# Patient Record
Sex: Male | Born: 1935 | Race: White | Hispanic: No | Smoking: Never smoker
Health system: Southern US, Community
[De-identification: ages and names within clinical notes are randomized; demographics above are authoritative.]

## PROBLEM LIST (undated history)

## (undated) DIAGNOSIS — I4891 Unspecified atrial fibrillation: Secondary | ICD-10-CM

## (undated) DIAGNOSIS — J310 Chronic rhinitis: Secondary | ICD-10-CM

## (undated) DIAGNOSIS — C801 Malignant (primary) neoplasm, unspecified: Secondary | ICD-10-CM

## (undated) DIAGNOSIS — I5032 Chronic diastolic (congestive) heart failure: Secondary | ICD-10-CM

## (undated) DIAGNOSIS — K219 Gastro-esophageal reflux disease without esophagitis: Secondary | ICD-10-CM

## (undated) DIAGNOSIS — I10 Essential (primary) hypertension: Secondary | ICD-10-CM

## (undated) HISTORY — PX: INNER EAR SURGERY: SHX679

## (undated) HISTORY — PX: EYE SURGERY: SHX253

## (undated) HISTORY — PX: HERNIA REPAIR: SHX51

---

## 2006-10-17 ENCOUNTER — Ambulatory Visit: Payer: Self-pay | Admitting: Internal Medicine

## 2006-10-22 ENCOUNTER — Ambulatory Visit: Payer: Self-pay | Admitting: Internal Medicine

## 2007-02-12 ENCOUNTER — Emergency Department: Payer: Self-pay | Admitting: Emergency Medicine

## 2007-02-12 ENCOUNTER — Other Ambulatory Visit: Payer: Self-pay

## 2007-02-25 ENCOUNTER — Ambulatory Visit: Payer: Self-pay | Admitting: Urology

## 2007-02-26 ENCOUNTER — Ambulatory Visit: Payer: Self-pay | Admitting: Urology

## 2007-05-18 ENCOUNTER — Ambulatory Visit: Payer: Self-pay | Admitting: Urology

## 2007-06-18 ENCOUNTER — Ambulatory Visit: Payer: Self-pay | Admitting: Unknown Physician Specialty

## 2010-11-15 ENCOUNTER — Ambulatory Visit: Payer: Self-pay | Admitting: Internal Medicine

## 2011-02-16 ENCOUNTER — Emergency Department: Payer: Self-pay | Admitting: Emergency Medicine

## 2012-08-06 DIAGNOSIS — H43813 Vitreous degeneration, bilateral: Secondary | ICD-10-CM | POA: Insufficient documentation

## 2012-08-06 DIAGNOSIS — H33059 Total retinal detachment, unspecified eye: Secondary | ICD-10-CM | POA: Insufficient documentation

## 2012-08-06 DIAGNOSIS — H269 Unspecified cataract: Secondary | ICD-10-CM | POA: Insufficient documentation

## 2013-07-07 DIAGNOSIS — J309 Allergic rhinitis, unspecified: Secondary | ICD-10-CM | POA: Insufficient documentation

## 2013-07-07 DIAGNOSIS — I10 Essential (primary) hypertension: Secondary | ICD-10-CM | POA: Insufficient documentation

## 2013-07-07 DIAGNOSIS — N400 Enlarged prostate without lower urinary tract symptoms: Secondary | ICD-10-CM | POA: Insufficient documentation

## 2013-07-07 DIAGNOSIS — K219 Gastro-esophageal reflux disease without esophagitis: Secondary | ICD-10-CM | POA: Insufficient documentation

## 2013-11-20 ENCOUNTER — Emergency Department: Payer: Self-pay | Admitting: Emergency Medicine

## 2013-11-20 LAB — TROPONIN I: Troponin-I: <0.02 ng/mL

## 2013-11-20 LAB — CBC
HCT: 44.2 % (ref 40.0–52.0)
HGB: 14.5 g/dL (ref 13.0–18.0)
MCH: 30 pg (ref 26.0–34.0)
MCHC: 32.7 g/dL (ref 32.0–36.0)
MCV: 92 fL (ref 80–100)
Platelet: 115 10*3/uL — ABNORMAL LOW (ref 150–440)
RBC: 4.82 10*6/uL (ref 4.40–5.90)
RDW: 13.4 % (ref 11.5–14.5)
WBC: 6.5 10*3/uL (ref 3.8–10.6)

## 2013-11-20 LAB — COMPREHENSIVE METABOLIC PANEL
ALBUMIN: 3.6 g/dL (ref 3.4–5.0)
ALT: 19 U/L
ANION GAP: 9 (ref 7–16)
Alkaline Phosphatase: 51 U/L
BILIRUBIN TOTAL: 0.8 mg/dL (ref 0.2–1.0)
BUN: 13 mg/dL (ref 7–18)
CALCIUM: 8.1 mg/dL — AB (ref 8.5–10.1)
CHLORIDE: 100 mmol/L (ref 98–107)
CREATININE: 0.84 mg/dL (ref 0.60–1.30)
Co2: 24 mmol/L (ref 21–32)
EGFR (African American): >60
Glucose: 93 mg/dL (ref 65–99)
Osmolality: 266 (ref 275–301)
Potassium: 3.5 mmol/L (ref 3.5–5.1)
SGOT(AST): 18 U/L (ref 15–37)
SODIUM: 133 mmol/L — AB (ref 136–145)
Total Protein: 6.5 g/dL (ref 6.4–8.2)

## 2014-09-02 ENCOUNTER — Encounter: Payer: Self-pay | Admitting: *Deleted

## 2014-09-05 ENCOUNTER — Encounter: Payer: Self-pay | Admitting: Anesthesiology

## 2014-09-05 ENCOUNTER — Ambulatory Visit: Payer: Medicare Other | Admitting: Anesthesiology

## 2014-09-05 ENCOUNTER — Encounter
Admission: RE | Disposition: A | Discharge status: Home / Self Care | Payer: Self-pay | Source: Ambulatory Visit | Attending: Unknown Physician Specialty

## 2014-09-05 ENCOUNTER — Ambulatory Visit
Admission: RE | Admit: 2014-09-05 | Discharge: 2014-09-05 | Disposition: A | Discharge status: Home / Self Care | Payer: Medicare Other | Source: Ambulatory Visit | Attending: Unknown Physician Specialty | Admitting: Unknown Physician Specialty

## 2014-09-05 DIAGNOSIS — K5730 Diverticulosis of large intestine without perforation or abscess without bleeding: Secondary | ICD-10-CM | POA: Insufficient documentation

## 2014-09-05 DIAGNOSIS — Z86010 Personal history of colonic polyps: Secondary | ICD-10-CM | POA: Diagnosis not present

## 2014-09-05 DIAGNOSIS — I10 Essential (primary) hypertension: Secondary | ICD-10-CM | POA: Diagnosis not present

## 2014-09-05 DIAGNOSIS — Z7982 Long term (current) use of aspirin: Secondary | ICD-10-CM | POA: Insufficient documentation

## 2014-09-05 DIAGNOSIS — Z1211 Encounter for screening for malignant neoplasm of colon: Secondary | ICD-10-CM | POA: Diagnosis present

## 2014-09-05 DIAGNOSIS — Z79899 Other long term (current) drug therapy: Secondary | ICD-10-CM | POA: Insufficient documentation

## 2014-09-05 DIAGNOSIS — K648 Other hemorrhoids: Secondary | ICD-10-CM | POA: Insufficient documentation

## 2014-09-05 DIAGNOSIS — K219 Gastro-esophageal reflux disease without esophagitis: Secondary | ICD-10-CM | POA: Insufficient documentation

## 2014-09-05 HISTORY — DX: Essential (primary) hypertension: I10

## 2014-09-05 HISTORY — DX: Gastro-esophageal reflux disease without esophagitis: K21.9

## 2014-09-05 HISTORY — DX: Chronic rhinitis: J31.0

## 2014-09-05 HISTORY — PX: COLONOSCOPY WITH PROPOFOL: SHX5780

## 2014-09-05 SURGERY — COLONOSCOPY WITH PROPOFOL
Anesthesia: General

## 2014-09-05 MED ORDER — SODIUM CHLORIDE 0.9 % IV SOLN
INTRAVENOUS | Status: DC
  Administered 2014-09-05: 1000 mL via INTRAVENOUS

## 2014-09-05 MED ORDER — PROPOFOL INFUSION 10 MG/ML OPTIME
INTRAVENOUS | Status: DC | PRN
  Administered 2014-09-05: 75 ug/kg/min via INTRAVENOUS

## 2014-09-05 MED ORDER — SODIUM CHLORIDE 0.9 % IV SOLN: INTRAVENOUS | Status: DC

## 2014-09-05 MED ORDER — FENTANYL CITRATE (PF) 100 MCG/2ML IJ SOLN
INTRAMUSCULAR | Status: DC | PRN
  Administered 2014-09-05: 50 ug via INTRAVENOUS

## 2014-09-05 MED ORDER — MIDAZOLAM HCL 2 MG/2ML IJ SOLN
INTRAMUSCULAR | Status: DC | PRN
  Administered 2014-09-05: 1 mg via INTRAVENOUS

## 2014-09-05 NOTE — Anesthesia Postprocedure Evaluation (Signed)
  Anesthesia Post-op Note  Patient: Derek James  Procedure(s) Performed: Procedure(s): COLONOSCOPY WITH PROPOFOL (N/A)  Anesthesia type:General  Patient location: PACU  Post pain: Pain level controlled  Post assessment: Post-op Vital signs reviewed, Patient's Cardiovascular Status Stable, Respiratory Function Stable, Patent Airway and No signs of Nausea or vomiting  Post vital signs: Reviewed and stable  Last Vitals:  Filed Vitals:   09/05/14 0900  BP: 126/66  Pulse: 69  Temp:   Resp: 20    Level of consciousness: awake, alert  and patient cooperative  Complications: No apparent anesthesia complications

## 2014-09-05 NOTE — Anesthesia Procedure Notes (Signed)
Performed by: COOK-MARTIN, Barbara Keng Pre-anesthesia Checklist: Patient identified, Emergency Drugs available, Suction available, Patient being monitored and Timeout performed Patient Re-evaluated:Patient Re-evaluated prior to inductionOxygen Delivery Method: Nasal cannula Preoxygenation: Pre-oxygenation with 100% oxygen Intubation Type: IV induction Placement Confirmation: positive ETCO2 and CO2 detector       

## 2014-09-05 NOTE — Op Note (Signed)
[facility] Gastroenterology Patient Name: Derek James Procedure Date: 09/05/2014 7:44 AM MRN: [id number] Account #: [id number] Date of Birth: 11/16/1930 Admit Type: Outpatient Age: 80 Room: [facility] ENDO ROOM 1 Gender: Male Note Status: Finalized Procedure:         Colonoscopy Indications:       Personal history of colonic polyps Providers:         Manya Silvas, MD Referring MD:      Caprice Renshaw (Referring MD) Medicines:         Propofol per Anesthesia Complications:     No immediate complications. Procedure:         Pre-Anesthesia Assessment:                    - After reviewing the risks and benefits, the patient was                     deemed in satisfactory condition to undergo the procedure.                    After obtaining informed consent, the colonoscope was                     passed under direct vision. Throughout the procedure, the                     patient's blood pressure, pulse, and oxygen saturations                     were monitored continuously. The Colonoscope was                     introduced through the anus and advanced to the the cecum,                     identified by appendiceal orifice and ileocecal valve. The                     colonoscopy was somewhat difficult due to restricted                     mobility of the colon and a tortuous colon. The patient                     tolerated the procedure well. The quality of the bowel                     preparation was adequate to identify polyps. Findings:      Multiple medium-mouthed diverticula were found in the sigmoid colon and       in the descending colon.      Internal hemorrhoids were found during endoscopy. The hemorrhoids were       small and Grade I (internal hemorrhoids that do not prolapse).      The exam was otherwise without abnormality. Impression:        - Diverticulosis in the sigmoid colon and in the                     descending colon.        - Internal hemorrhoids.                    - The examination was otherwise normal.                    -  No specimens collected. Recommendation:    - The findings and recommendations were discussed with the                     patient's family. Manya Silvas, MD 09/05/2014 8:29:24 AM This report has been signed electronically. Number of Addenda: 0 Note Initiated On: 09/05/2014 7:44 AM Scope Withdrawal Time: 0 hours 8 minutes 19 seconds  Total Procedure Duration: 0 hours 17 minutes 52 seconds       [facility]

## 2014-09-05 NOTE — H&P (Signed)
Primary Care Physician:  Rocco Serene, MD Primary Gastroenterologist:  Dr. Vira Agar  Pre-Procedure History & Physical: HPI:  Derek James is a 81 y.o. male is here for an colonoscopy.   Past Medical History  Diagnosis Date  . Hypertension   . Rhinitis   . GERD (gastroesophageal reflux disease)     Past Surgical History  Procedure Laterality Date  . Inner ear surgery    . Hernia repair    . Eye surgery      retinal detatchment    Prior to Admission medications   Medication Sig Start Date End Date Taking? Authorizing Provider  aspirin EC 81 MG tablet Take 81 mg by mouth daily.   Yes Historical Provider, MD  esomeprazole (NEXIUM) 20 MG capsule Take 20 mg by mouth daily at 12 noon.   Yes Historical Provider, MD  hydrochlorothiazide (HYDRODIURIL) 25 MG tablet Take 25 mg by mouth daily.   Yes Historical Provider, MD  loratadine (CLARITIN) 10 MG tablet Take 10 mg by mouth daily.   Yes Historical Provider, MD  tamsulosin (FLOMAX) 0.4 MG CAPS capsule Take 0.4 mg by mouth.   Yes Historical Provider, MD    Allergies as of 08/03/2014  . (Not on File)    History reviewed. No pertinent family history.  History   Social History  . Marital Status: Widowed    Spouse Name: N/A  . Number of Children: N/A  . Years of Education: N/A   Occupational History  . Not on file.   Social History Main Topics  . Smoking status: Never Smoker   . Smokeless tobacco: Not on file  . Alcohol Use: Not on file  . Drug Use: Not on file  . Sexual Activity: Not on file   Other Topics Concern  . Not on file   Social History Narrative    Review of Systems: See HPI, otherwise negative ROS  Physical Exam: BP 93/56 mmHg  Pulse 68  Temp(Src) 96.2 F (35.7 C) (Tympanic)  Resp 17  Ht 5\' 5"  (1.651 m)  Wt 68.04 kg (150 lb)  BMI 24.96 kg/m2  SpO2 98% General:   Alert,  pleasant and cooperative in NAD Head:  Normocephalic and atraumatic. Neck:  Supple; no masses or  thyromegaly. Lungs:  Clear throughout to auscultation.    Heart:  Regular rate and rhythm. Abdomen:  Soft, nontender and nondistended. Normal bowel sounds, without guarding, and without rebound.   Neurologic:  Alert and  oriented x4;  grossly normal neurologically.  Impression/Plan: Derek James is here for an colonoscopy to be performed for personal history of colon polyps  Risks, benefits, limitations, and alternatives regarding  colonoscopy have been reviewed with the patient.  Questions have been answered.  All parties agreeable.   Gaylyn Cheers, MD  09/05/2014, 8:32 AM   Primary Care Physician:  Rocco Serene, MD Primary Gastroenterologist:  Dr. Vira Agar  Pre-Procedure History & Physical: HPI:  Derek James is a 81 y.o. male is here for an colonoscopy.   Past Medical History  Diagnosis Date  . Hypertension   . Rhinitis   . GERD (gastroesophageal reflux disease)     Past Surgical History  Procedure Laterality Date  . Inner ear surgery    . Hernia repair    . Eye surgery      retinal detatchment    Prior to Admission medications   Medication Sig Start Date End Date Taking? Authorizing Provider  aspirin EC 81 MG tablet Take  81 mg by mouth daily.   Yes Historical Provider, MD  esomeprazole (NEXIUM) 20 MG capsule Take 20 mg by mouth daily at 12 noon.   Yes Historical Provider, MD  hydrochlorothiazide (HYDRODIURIL) 25 MG tablet Take 25 mg by mouth daily.   Yes Historical Provider, MD  loratadine (CLARITIN) 10 MG tablet Take 10 mg by mouth daily.   Yes Historical Provider, MD  tamsulosin (FLOMAX) 0.4 MG CAPS capsule Take 0.4 mg by mouth.   Yes Historical Provider, MD    Allergies as of 08/03/2014  . (Not on File)    History reviewed. No pertinent family history.  History   Social History  . Marital Status: Widowed    Spouse Name: N/A  . Number of Children: N/A  . Years of Education: N/A   Occupational History  . Not on file.   Social History  Main Topics  . Smoking status: Never Smoker   . Smokeless tobacco: Not on file  . Alcohol Use: Not on file  . Drug Use: Not on file  . Sexual Activity: Not on file   Other Topics Concern  . Not on file   Social History Narrative    Review of Systems: See HPI, otherwise negative ROS  Physical Exam: BP 93/56 mmHg  Pulse 68  Temp(Src) 96.2 F (35.7 C) (Tympanic)  Resp 17  Ht 5\' 5"  (1.651 m)  Wt 68.04 kg (150 lb)  BMI 24.96 kg/m2  SpO2 98% General:   Alert,  pleasant and cooperative in NAD Head:  Normocephalic and atraumatic. Neck:  Supple; no masses or thyromegaly. Lungs:  Clear throughout to auscultation.    Heart:  Regular rate and rhythm. Abdomen:  Soft, nontender and nondistended. Normal bowel sounds, without guarding, and without rebound.   Neurologic:  Alert and  oriented x4;  grossly normal neurologically.  Impression/Plan: Derek James is here for an colonoscopy to be performed for personal history of colon polyps  Risks, benefits, limitations, and alternatives regarding  colonoscopy have been reviewed with the patient.  Questions have been answered.  All parties agreeable.   Gaylyn Cheers, MD  09/05/2014, 8:32 AM

## 2014-09-05 NOTE — Transfer of Care (Signed)
Immediate Anesthesia Transfer of Care Note  Patient: Derek James  Procedure(s) Performed: Procedure(s): COLONOSCOPY WITH PROPOFOL (N/A)  Patient Location: PACU  Anesthesia Type:General  Level of Consciousness: awake and sedated  Airway & Oxygen Therapy: Patient Spontanous Breathing and Patient connected to nasal cannula oxygen  Post-op Assessment: Report given to RN and Post -op Vital signs reviewed and stable  Post vital signs: Reviewed and stable  Last Vitals:  Filed Vitals:   09/05/14 0830  BP: 93/56  Pulse: 68  Temp: 35.7 C  Resp: 17    Complications: No apparent anesthesia complications

## 2014-09-05 NOTE — Anesthesia Preprocedure Evaluation (Signed)
Anesthesia Evaluation  Patient identified by MRN, date of birth, ID band Patient confused    Reviewed: Allergy & Precautions, NPO status , Patient's Chart, lab work & pertinent test results  Airway Mallampati: II       Dental no notable dental hx.    Pulmonary neg pulmonary ROS,    Pulmonary exam normal       Cardiovascular hypertension, Pt. on medications Normal cardiovascular exam    Neuro/Psych negative neurological ROS  negative psych ROS   GI/Hepatic Neg liver ROS, GERD-  ,  Endo/Other  negative endocrine ROS  Renal/GU negative Renal ROS     Musculoskeletal negative musculoskeletal ROS (+)   Abdominal Normal abdominal exam  (+)   Peds negative pediatric ROS (+)  Hematology negative hematology ROS (+)   Anesthesia Other Findings   Reproductive/Obstetrics negative OB ROS                             Anesthesia Physical Anesthesia Plan  ASA: III  Anesthesia Plan: General   Post-op Pain Management:    Induction: Intravenous  Airway Management Planned: Nasal Cannula  Additional Equipment:   Intra-op Plan:   Post-operative Plan:   Informed Consent: I have reviewed the patients History and Physical, chart, labs and discussed the procedure including the risks, benefits and alternatives for the proposed anesthesia with the patient or authorized representative who has indicated his/her understanding and acceptance.     Plan Discussed with: CRNA  Anesthesia Plan Comments:         Anesthesia Quick Evaluation

## 2014-09-06 ENCOUNTER — Encounter: Payer: Self-pay | Admitting: Unknown Physician Specialty

## 2014-09-21 DIAGNOSIS — H9072 Mixed conductive and sensorineural hearing loss, unilateral, left ear, with unrestricted hearing on the contralateral side: Secondary | ICD-10-CM | POA: Insufficient documentation

## 2014-09-21 DIAGNOSIS — H722X1 Other marginal perforations of tympanic membrane, right ear: Secondary | ICD-10-CM | POA: Insufficient documentation

## 2014-09-21 DIAGNOSIS — Z9089 Acquired absence of other organs: Secondary | ICD-10-CM | POA: Insufficient documentation

## 2014-09-21 DIAGNOSIS — H9191 Unspecified hearing loss, right ear: Secondary | ICD-10-CM | POA: Insufficient documentation

## 2014-09-21 DIAGNOSIS — H7121 Cholesteatoma of mastoid, right ear: Secondary | ICD-10-CM | POA: Insufficient documentation

## 2015-05-27 ENCOUNTER — Emergency Department: Payer: Medicare Other

## 2015-05-27 ENCOUNTER — Inpatient Hospital Stay
Admission: EM | Admit: 2015-05-27 | Discharge: 2015-06-02 | DRG: 640 | Disposition: A | Discharge status: Home / Self Care | Payer: Medicare Other | Attending: Internal Medicine | Admitting: Internal Medicine

## 2015-05-27 ENCOUNTER — Encounter: Payer: Self-pay | Admitting: Emergency Medicine

## 2015-05-27 DIAGNOSIS — E871 Hypo-osmolality and hyponatremia: Secondary | ICD-10-CM | POA: Diagnosis not present

## 2015-05-27 DIAGNOSIS — Z888 Allergy status to other drugs, medicaments and biological substances status: Secondary | ICD-10-CM | POA: Diagnosis not present

## 2015-05-27 DIAGNOSIS — Z8249 Family history of ischemic heart disease and other diseases of the circulatory system: Secondary | ICD-10-CM

## 2015-05-27 DIAGNOSIS — R0600 Dyspnea, unspecified: Secondary | ICD-10-CM | POA: Diagnosis not present

## 2015-05-27 DIAGNOSIS — I5021 Acute systolic (congestive) heart failure: Secondary | ICD-10-CM | POA: Diagnosis not present

## 2015-05-27 DIAGNOSIS — E46 Unspecified protein-calorie malnutrition: Secondary | ICD-10-CM | POA: Diagnosis present

## 2015-05-27 DIAGNOSIS — J9601 Acute respiratory failure with hypoxia: Secondary | ICD-10-CM | POA: Diagnosis present

## 2015-05-27 DIAGNOSIS — J168 Pneumonia due to other specified infectious organisms: Secondary | ICD-10-CM | POA: Diagnosis present

## 2015-05-27 DIAGNOSIS — E860 Dehydration: Secondary | ICD-10-CM | POA: Diagnosis present

## 2015-05-27 DIAGNOSIS — T502X5A Adverse effect of carbonic-anhydrase inhibitors, benzothiadiazides and other diuretics, initial encounter: Secondary | ICD-10-CM | POA: Diagnosis present

## 2015-05-27 DIAGNOSIS — J309 Allergic rhinitis, unspecified: Secondary | ICD-10-CM | POA: Diagnosis present

## 2015-05-27 DIAGNOSIS — Z7982 Long term (current) use of aspirin: Secondary | ICD-10-CM | POA: Diagnosis not present

## 2015-05-27 DIAGNOSIS — R0989 Other specified symptoms and signs involving the circulatory and respiratory systems: Secondary | ICD-10-CM | POA: Diagnosis present

## 2015-05-27 DIAGNOSIS — I110 Hypertensive heart disease with heart failure: Secondary | ICD-10-CM | POA: Diagnosis present

## 2015-05-27 DIAGNOSIS — Z79899 Other long term (current) drug therapy: Secondary | ICD-10-CM

## 2015-05-27 DIAGNOSIS — R251 Tremor, unspecified: Secondary | ICD-10-CM | POA: Diagnosis present

## 2015-05-27 DIAGNOSIS — I4891 Unspecified atrial fibrillation: Secondary | ICD-10-CM | POA: Diagnosis not present

## 2015-05-27 DIAGNOSIS — K219 Gastro-esophageal reflux disease without esophagitis: Secondary | ICD-10-CM | POA: Diagnosis present

## 2015-05-27 DIAGNOSIS — D6959 Other secondary thrombocytopenia: Secondary | ICD-10-CM | POA: Diagnosis present

## 2015-05-27 DIAGNOSIS — I340 Nonrheumatic mitral (valve) insufficiency: Secondary | ICD-10-CM | POA: Diagnosis present

## 2015-05-27 DIAGNOSIS — R0902 Hypoxemia: Secondary | ICD-10-CM

## 2015-05-27 DIAGNOSIS — I10 Essential (primary) hypertension: Secondary | ICD-10-CM | POA: Diagnosis present

## 2015-05-27 DIAGNOSIS — Z7901 Long term (current) use of anticoagulants: Secondary | ICD-10-CM | POA: Diagnosis not present

## 2015-05-27 DIAGNOSIS — A419 Sepsis, unspecified organism: Secondary | ICD-10-CM | POA: Diagnosis present

## 2015-05-27 DIAGNOSIS — N400 Enlarged prostate without lower urinary tract symptoms: Secondary | ICD-10-CM | POA: Diagnosis present

## 2015-05-27 DIAGNOSIS — R0603 Acute respiratory distress: Secondary | ICD-10-CM | POA: Diagnosis not present

## 2015-05-27 DIAGNOSIS — R05 Cough: Secondary | ICD-10-CM

## 2015-05-27 DIAGNOSIS — I509 Heart failure, unspecified: Secondary | ICD-10-CM

## 2015-05-27 DIAGNOSIS — I5033 Acute on chronic diastolic (congestive) heart failure: Secondary | ICD-10-CM | POA: Diagnosis not present

## 2015-05-27 DIAGNOSIS — R059 Cough, unspecified: Secondary | ICD-10-CM

## 2015-05-27 DIAGNOSIS — R531 Weakness: Secondary | ICD-10-CM

## 2015-05-27 HISTORY — DX: Chronic diastolic (congestive) heart failure: I50.32

## 2015-05-27 HISTORY — DX: Unspecified atrial fibrillation: I48.91

## 2015-05-27 LAB — BASIC METABOLIC PANEL
Anion gap: 7 (ref 5–15)
BUN: 16 mg/dL (ref 6–20)
CHLORIDE: 93 mmol/L — AB (ref 101–111)
CO2: 21 mmol/L — ABNORMAL LOW (ref 22–32)
CREATININE: 0.81 mg/dL (ref 0.61–1.24)
Calcium: 7.4 mg/dL — ABNORMAL LOW (ref 8.9–10.3)
Glucose, Bld: 107 mg/dL — ABNORMAL HIGH (ref 65–99)
POTASSIUM: 3.8 mmol/L (ref 3.5–5.1)
SODIUM: 121 mmol/L — AB (ref 135–145)

## 2015-05-27 LAB — COMPREHENSIVE METABOLIC PANEL
ALBUMIN: 3.7 g/dL (ref 3.5–5.0)
ALK PHOS: 52 U/L (ref 38–126)
ALT: 24 U/L (ref 17–63)
AST: 32 U/L (ref 15–41)
Anion gap: 10 (ref 5–15)
BUN: 13 mg/dL (ref 6–20)
CALCIUM: 8.1 mg/dL — AB (ref 8.9–10.3)
CO2: 22 mmol/L (ref 22–32)
Chloride: 89 mmol/L — ABNORMAL LOW (ref 101–111)
Creatinine, Ser: 0.87 mg/dL (ref 0.61–1.24)
GFR calc non Af Amer: >60 mL/min (ref >60)
GLUCOSE: 94 mg/dL (ref 65–99)
POTASSIUM: 3.8 mmol/L (ref 3.5–5.1)
SODIUM: 121 mmol/L — AB (ref 135–145)
Total Bilirubin: 1.1 mg/dL (ref 0.3–1.2)
Total Protein: 6.6 g/dL (ref 6.5–8.1)

## 2015-05-27 LAB — URINALYSIS COMPLETE WITH MICROSCOPIC (ARMC ONLY)
Bilirubin Urine: NEGATIVE
GLUCOSE, UA: NEGATIVE mg/dL
Leukocytes, UA: NEGATIVE
Nitrite: NEGATIVE
PROTEIN: NEGATIVE mg/dL
Specific Gravity, Urine: 1.017 (ref 1.005–1.030)
Squamous Epithelial / LPF: NONE SEEN
pH: 6 (ref 5.0–8.0)

## 2015-05-27 LAB — CBC WITH DIFFERENTIAL/PLATELET
Basophils Absolute: 0 10*3/uL (ref 0–0.1)
Basophils Relative: 0 %
EOS ABS: 0 10*3/uL (ref 0–0.7)
Eosinophils Relative: 0 %
HCT: 39.8 % — ABNORMAL LOW (ref 40.0–52.0)
HEMOGLOBIN: 13.9 g/dL (ref 13.0–18.0)
LYMPHS ABS: 0.5 10*3/uL — AB (ref 1.0–3.6)
Lymphocytes Relative: 11 %
MCH: 29.9 pg (ref 26.0–34.0)
MCHC: 35 g/dL (ref 32.0–36.0)
MCV: 85.5 fL (ref 80.0–100.0)
MONO ABS: 0.5 10*3/uL (ref 0.2–1.0)
Monocytes Relative: 12 %
Neutro Abs: 3.4 10*3/uL (ref 1.4–6.5)
Platelets: 69 10*3/uL — ABNORMAL LOW (ref 150–440)
RBC: 4.65 MIL/uL (ref 4.40–5.90)
RDW: 13.5 % (ref 11.5–14.5)
WBC: 4.4 10*3/uL (ref 3.8–10.6)

## 2015-05-27 LAB — TROPONIN I: Troponin I: <0.03 ng/mL (ref <0.031)

## 2015-05-27 MED ORDER — ONDANSETRON HCL 4 MG/2ML IJ SOLN: 4.0000 mg | Freq: Four times a day (QID) | INTRAMUSCULAR | Status: DC | PRN

## 2015-05-27 MED ORDER — METOPROLOL TARTRATE 25 MG PO TABS
25.0000 mg | ORAL_TABLET | Freq: Two times a day (BID) | ORAL | Status: DC
  Administered 2015-05-28 – 2015-06-02 (×12): 25 mg via ORAL
  Filled 2015-05-27 (×12): qty 1

## 2015-05-27 MED ORDER — LORATADINE 10 MG PO TABS
10.0000 mg | ORAL_TABLET | Freq: Every day | ORAL | Status: DC
  Administered 2015-05-27 – 2015-06-02 (×7): 10 mg via ORAL
  Filled 2015-05-27 (×8): qty 1

## 2015-05-27 MED ORDER — ACETAMINOPHEN 325 MG PO TABS
650.0000 mg | ORAL_TABLET | Freq: Four times a day (QID) | ORAL | Status: DC | PRN
  Administered 2015-05-27 – 2015-05-28 (×2): 650 mg via ORAL
  Filled 2015-05-27 (×2): qty 2

## 2015-05-27 MED ORDER — DOCUSATE SODIUM 100 MG PO CAPS
100.0000 mg | ORAL_CAPSULE | Freq: Two times a day (BID) | ORAL | Status: DC
  Administered 2015-05-28 – 2015-06-02 (×11): 100 mg via ORAL
  Filled 2015-05-27 (×12): qty 1

## 2015-05-27 MED ORDER — SODIUM CHLORIDE 0.9 % IV SOLN
Freq: Once | INTRAVENOUS | Status: AC
  Administered 2015-05-27: 13:00:00 via INTRAVENOUS

## 2015-05-27 MED ORDER — TAMSULOSIN HCL 0.4 MG PO CAPS
0.4000 mg | ORAL_CAPSULE | Freq: Every day | ORAL | Status: DC
  Administered 2015-05-28 – 2015-06-02 (×6): 0.4 mg via ORAL
  Filled 2015-05-27 (×6): qty 1

## 2015-05-27 MED ORDER — BISACODYL 10 MG RE SUPP: 10.0000 mg | Freq: Every day | RECTAL | Status: DC | PRN

## 2015-05-27 MED ORDER — PANTOPRAZOLE SODIUM 40 MG PO TBEC
40.0000 mg | DELAYED_RELEASE_TABLET | Freq: Every day | ORAL | Status: DC
  Administered 2015-05-28: 40 mg via ORAL
  Filled 2015-05-27 (×2): qty 1

## 2015-05-27 MED ORDER — ASPIRIN EC 81 MG PO TBEC
81.0000 mg | DELAYED_RELEASE_TABLET | Freq: Every day | ORAL | Status: DC
  Administered 2015-05-27 – 2015-05-29 (×3): 81 mg via ORAL
  Filled 2015-05-27 (×3): qty 1

## 2015-05-27 MED ORDER — FLUTICASONE PROPIONATE 50 MCG/ACT NA SUSP
2.0000 | Freq: Every day | NASAL | Status: DC
  Administered 2015-05-27 – 2015-06-01 (×6): 2 via NASAL
  Filled 2015-05-27: qty 16

## 2015-05-27 MED ORDER — HEPARIN SODIUM (PORCINE) 5000 UNIT/ML IJ SOLN
5000.0000 [IU] | Freq: Three times a day (TID) | INTRAMUSCULAR | Status: DC
  Administered 2015-05-28: 5000 [IU] via SUBCUTANEOUS
  Filled 2015-05-27 (×2): qty 1

## 2015-05-27 MED ORDER — ONDANSETRON HCL 4 MG PO TABS
4.0000 mg | ORAL_TABLET | Freq: Four times a day (QID) | ORAL | Status: DC | PRN
Start: 2015-05-27 — End: 2015-06-02

## 2015-05-27 MED ORDER — ACETAMINOPHEN 650 MG RE SUPP: 650.0000 mg | Freq: Four times a day (QID) | RECTAL | Status: DC | PRN

## 2015-05-27 MED ORDER — AMLODIPINE BESYLATE 5 MG PO TABS
5.0000 mg | ORAL_TABLET | Freq: Every day | ORAL | Status: DC
  Administered 2015-05-28 – 2015-05-30 (×3): 5 mg via ORAL
  Filled 2015-05-27 (×3): qty 1

## 2015-05-27 MED ORDER — SODIUM CHLORIDE 0.9 % IV SOLN
INTRAVENOUS | Status: DC
  Administered 2015-05-27: 19:00:00 via INTRAVENOUS

## 2015-05-27 MED ORDER — ZOLPIDEM TARTRATE 5 MG PO TABS
5.0000 mg | ORAL_TABLET | Freq: Every evening | ORAL | Status: DC | PRN
  Administered 2015-05-27 – 2015-06-01 (×4): 5 mg via ORAL
  Filled 2015-05-27 (×6): qty 1

## 2015-05-27 NOTE — ED Notes (Signed)
Pt assisted to the bathroom.

## 2015-05-27 NOTE — ED Notes (Signed)
Report called to [place], Therapist, sports.

## 2015-05-27 NOTE — ED Provider Notes (Signed)
[facility] Emergency Department Provider Note  ____________________________________________  Time seen: Approximately 12:04 PM  I have reviewed the triage vital signs and the nursing notes.   HISTORY  Chief Complaint Abnormal Lab    HPI Derek James is a 82 y.o. male who reports he had some neck and back tightness for about a week. He's had low-grade fever of 99 for several days and occasional nonproductive cough. Patient went to see scrotal clinic twice and couldn't find anything they told him drink lots of fluids as he thought he had a virus and today his sodium was 120 so he was sent to the emergency room. Patient feels weak and dizzy today lightheaded little bit unsteady on his feet.   Past Medical History  Diagnosis Date  . Hypertension   . Rhinitis   . GERD (gastroesophageal reflux disease)     There are no active problems to display for this patient.   Past Surgical History  Procedure Laterality Date  . Inner ear surgery    . Hernia repair    . Eye surgery      retinal detatchment  . Colonoscopy with propofol N/A 09/05/2014    Procedure: COLONOSCOPY WITH PROPOFOL;  Surgeon: Manya Silvas, MD;  Location: [facility] ENDOSCOPY;  Service: Endoscopy;  Laterality: N/A;    Current Outpatient Rx  Name  Route  Sig  Dispense  Refill  . aspirin EC 81 MG tablet   Oral   Take 81 mg by mouth daily.         Marland Kitchen esomeprazole (NEXIUM) 20 MG capsule   Oral   Take 20 mg by mouth daily at 12 noon.         . hydrochlorothiazide (HYDRODIURIL) 25 MG tablet   Oral   Take 25 mg by mouth daily.         Marland Kitchen loratadine (CLARITIN) 10 MG tablet   Oral   Take 10 mg by mouth daily.         . tamsulosin (FLOMAX) 0.4 MG CAPS capsule   Oral   Take 0.4 mg by mouth.           Allergies Escitalopram  History reviewed. No pertinent family history.  Social History Social History  Substance Use Topics  . Smoking status: Never Smoker   . Smokeless  tobacco: None  . Alcohol Use: No    Review of Systems Constitutional: See history of present illness Eyes: No visual changes. ENT: No sore throat. Cardiovascular: Denies chest pain. Respiratory: Denies shortness of breath. Gastrointestinal: No abdominal pain.  No nausea, no vomiting.  No diarrhea.  No constipation. Genitourinary: Negative for dysuria. Musculoskeletal: Negative for back pain. Skin: Negative for rash. Neurological: Negative for headaches, focal weakness or numbness.  10-point ROS otherwise negative.  ____________________________________________   PHYSICAL EXAM:  VITAL SIGNS: ED Triage Vitals  Enc Vitals Group     BP 05/27/15 1140 145/76 mmHg     Pulse Rate 05/27/15 1140 95     Resp 05/27/15 1140 19     Temp 05/27/15 1140 98.3 F (36.8 C)     Temp src --      SpO2 05/27/15 1140 99 %     Weight 05/27/15 1137 155 lb (70.308 kg)     Height 05/27/15 1137 5\' 6"  (1.676 m)     Head Cir --      Peak Flow --      Pain Score --      Pain Loc --  Pain Edu? --      Excl. in [place]? --     Constitutional: Alert and oriented. Well appearing and in no acute distress. Eyes: Conjunctivae are normal. PERRL. EOMI. Head: Atraumatic. Nose: No congestion/rhinnorhea. Mouth/Throat: Mucous membranes are moist.  Oropharynx non-erythematous. Neck: No stridor.  Cardiovascular: Normal rate, regular rhythm. Grossly normal heart sounds.  Good peripheral circulation. Respiratory: Normal respiratory effort.  No retractions. Lungs CTAB. Gastrointestinal: Soft and nontender. No distention. No abdominal bruits. No CVA tenderness. Musculoskeletal: No lower extremity tenderness nor edema.  No joint effusions. Neurologic:  Normal speech and language. No gross focal neurologic deficits are appreciated. No gait instability. Skin:  Skin is warm, dry and intact. No rash noted. Psychiatric: Mood and affect are normal. Speech and behavior are  normal.  ____________________________________________   LABS (all labs ordered are listed, but only abnormal results are displayed)  Labs Reviewed  COMPREHENSIVE METABOLIC PANEL - Abnormal; Notable for the following:    Sodium 121 (*)    Chloride 89 (*)    Calcium 8.1 (*)    All other components within normal limits  CBC WITH DIFFERENTIAL/PLATELET - Abnormal; Notable for the following:    HCT 39.8 (*)    Platelets 69 (*)    Lymphs Abs 0.5 (*)    All other components within normal limits  URINALYSIS COMPLETEWITH MICROSCOPIC ([facility] ONLY) - Abnormal; Notable for the following:    Color, Urine YELLOW (*)    APPearance CLEAR (*)    Ketones, ur 1+ (*)    Hgb urine dipstick 1+ (*)    Bacteria, UA RARE (*)    All other components within normal limits  TROPONIN I   ____________________________________________  EKG   ____________________________________________  RADIOLOGY  Chest x-ray read as no active disease by radiology ____________________________________________   PROCEDURES    ____________________________________________   INITIAL IMPRESSION / ASSESSMENT AND PLAN / ED COURSE  Pertinent labs & imaging results that were available during my care of the patient were reviewed by me and considered in my medical decision making (see chart for details).  _______________________________________   FINAL CLINICAL IMPRESSION(S) / ED DIAGNOSES  Final diagnoses:  Hyponatremia   Diagnosis is severe symptomatic hyponatremia   Nena Polio, MD 05/27/15 1344

## 2015-05-27 NOTE — Progress Notes (Signed)
Patient requested to hold htn meds until he can speak to the doctor about them. These are new meds for him.

## 2015-05-27 NOTE — ED Notes (Signed)
Went to [place] on Monday - learned about low sodium today (120)

## 2015-05-27 NOTE — H&P (Signed)
History and Physical    Derek James [id number] DOB: [birth year] DOA: 05/27/2015  Referring physician: Dr. Nicholas Lose PCP: Vianne Bulls. Arline Asp, MD  Specialists: none  Chief Complaint: weakness  HPI: Derek James is a 82 y.o. male has a past medical history significant for HTN and GERD who presents to ER with 1 week hx of progressive weakness with "achiness", tremor, and fatigue. No N/V. Minimal cough. Low grade fever at home. No N/V/D. Presented to ER where he was noted to have a sodium=121. Is on HCTZ for HTN chronically. He is now admitted. Denies CP or SOB. No seizures.  Review of Systems: The patient denies anorexia, weight loss,, vision loss, decreased hearing, hoarseness, chest pain, syncope, dyspnea on exertion, peripheral edema, balance deficits, hemoptysis, abdominal pain, melena, hematochezia, severe indigestion/heartburn, hematuria, incontinence, genital sores, muscle weakness, suspicious skin lesions, transient blindness, difficulty walking, depression, unusual weight change, abnormal bleeding, enlarged lymph nodes, angioedema, and breast masses.   Past Medical History  Diagnosis Date  . Hypertension   . Rhinitis   . GERD (gastroesophageal reflux disease)    Past Surgical History  Procedure Laterality Date  . Inner ear surgery    . Hernia repair    . Eye surgery      retinal detatchment  . Colonoscopy with propofol N/A 09/05/2014    Procedure: COLONOSCOPY WITH PROPOFOL;  Surgeon: Manya Silvas, MD;  Location: [facility] ENDOSCOPY;  Service: Endoscopy;  Laterality: N/A;   Social History:  reports that he has never smoked. He does not have any smokeless tobacco history on file. He reports that he does not drink alcohol. His drug history is not on file.  Allergies  Allergen Reactions  . Dexlansoprazole Diarrhea  . Escitalopram Diarrhea  . Lansoprazole Diarrhea    Family History  Problem Relation Age of Onset  . Hypertension Mother   . Hypertension Father   .  CAD Father     Prior to Admission medications   Medication Sig Start Date End Date Taking? Authorizing Provider  aspirin EC 81 MG tablet Take 81 mg by mouth daily.   Yes Historical Provider, MD  esomeprazole (NEXIUM) 20 MG capsule Take 20 mg by mouth daily at 12 noon.   Yes Historical Provider, MD  fluticasone (FLONASE) 50 MCG/ACT nasal spray Place 2 sprays into both nostrils daily. 05/08/15  Yes Historical Provider, MD  hydrochlorothiazide (HYDRODIURIL) 25 MG tablet Take 12.5 mg by mouth daily.    Yes Historical Provider, MD  loratadine (CLARITIN) 10 MG tablet Take 10 mg by mouth daily.   Yes Historical Provider, MD  Probiotic Product (ALIGN) 4 MG CAPS Take 4 mg by mouth daily. 10/24/14  Yes Historical Provider, MD  tamsulosin (FLOMAX) 0.4 MG CAPS capsule Take 0.4 mg by mouth daily.    Yes Historical Provider, MD  zolpidem (AMBIEN) 10 MG tablet Take 5 mg by mouth at bedtime as needed for sleep.  05/22/15  Yes Historical Provider, MD   Physical Exam: Filed Vitals:   05/27/15 1215 05/27/15 1245 05/27/15 1300 05/27/15 1315  BP: 135/68 130/71 134/74 140/82  Pulse: 83 86 90 87  Temp:      Resp: 27 20 22 27   Height:      Weight:      SpO2: 97% 96% 97% 94%     General:  No apparent distress, WDWN, [place]/AT  Eyes: PERRL, EOMI, no scleral icterus, conjunctiva clear  ENT: moist oropharynx without lesions or exudate, TM's benign, dentition fair  Neck: supple,  no lymphadenopathy. No bruits or thyromegaly  Cardiovascular: regular rate without MRG; 2+ peripheral pulses, no JVD, no peripheral edema  Respiratory: CTA biL, good air movement without wheezing, rhonchi or crackled. Respiratory effort normal  Abdomen: soft, non tender to palpation, positive bowel sounds, no guarding, no rebound, no organomegaly  Skin: no rashes or lesions  Musculoskeletal: normal bulk and tone, no joint swelling  Psychiatric: normal mood and affect, A&OX3  Neurologic: CN 2-12 grossly intact, Motor strength 5/5  in all 4 groups with symmetric DTR's and non-focal sensory exam  Labs on Admission:  Basic Metabolic Panel:  Recent Labs Lab 05/27/15 1246  NA 121*  K 3.8  CL 89*  CO2 22  GLUCOSE 94  BUN 13  CREATININE 0.87  CALCIUM 8.1*   Liver Function Tests:  Recent Labs Lab 05/27/15 1246  AST 32  ALT 24  ALKPHOS 52  BILITOT 1.1  PROT 6.6  ALBUMIN 3.7   No results for input(s): LIPASE, AMYLASE in the last 168 hours. No results for input(s): AMMONIA in the last 168 hours. CBC:  Recent Labs Lab 05/27/15 1246  WBC 4.4  NEUTROABS 3.4  HGB 13.9  HCT 39.8*  MCV 85.5  PLT 69*   Cardiac Enzymes:  Recent Labs Lab 05/27/15 1246  TROPONINI <0.03    BNP (last 3 results) No results for input(s): BNP in the last 8760 hours.  ProBNP (last 3 results) No results for input(s): PROBNP in the last 8760 hours.  CBG: No results for input(s): GLUCAP in the last 168 hours.  Radiological Exams on Admission: Dg Chest Portable 1 View  05/27/2015  CLINICAL DATA:  82 year old male with tightness in the neck and back and hyponatremia EXAM: PORTABLE CHEST 1 VIEW COMPARISON:  Prior chest x-ray 11/20/2013 FINDINGS: Portable frontal chest x-ray likely exaggerates the size of the cardiopericardial silhouette. No significant interval change compared to prior. Atherosclerotic calcifications again present in the transverse aorta. The lungs are clear. No pneumothorax or pleural effusion. Stable mild bronchitic change. No acute osseous abnormality. IMPRESSION: No active disease. Electronically Signed   By: Jacqulynn Cadet M.D.   On: 05/27/2015 13:12    EKG: Independently reviewed.  Assessment/Plan Principal Problem:   Hyponatremia Active Problems:   Weakness generalized   HTN, goal below 140/80   Tremor   Will observe on the floor and hold HCTZ. Begin IV NS. Daily labs. No indication for ABX at present. Consult PT and CM.  Diet: low salt Fluids: NS@125  DVT Prophylaxis: SQ Heparin  Code  Status: FULL  Family Communication: none  Disposition Plan: home  Time spent: 50 min

## 2015-05-27 NOTE — Progress Notes (Signed)
Called and spoke to [facility] regarding patients shirt and glasses. Patient states that they were left in the ED and he did not make it to 1A with them.

## 2015-05-28 ENCOUNTER — Inpatient Hospital Stay: Payer: Medicare Other

## 2015-05-28 LAB — COMPREHENSIVE METABOLIC PANEL
ALK PHOS: 40 U/L (ref 38–126)
ALT: 21 U/L (ref 17–63)
AST: 30 U/L (ref 15–41)
Albumin: 3 g/dL — ABNORMAL LOW (ref 3.5–5.0)
Anion gap: 6 (ref 5–15)
BILIRUBIN TOTAL: 0.8 mg/dL (ref 0.3–1.2)
BUN: 15 mg/dL (ref 6–20)
CALCIUM: 7.6 mg/dL — AB (ref 8.9–10.3)
CHLORIDE: 97 mmol/L — AB (ref 101–111)
CO2: 22 mmol/L (ref 22–32)
CREATININE: 0.87 mg/dL (ref 0.61–1.24)
Glucose, Bld: 80 mg/dL (ref 65–99)
Potassium: 3.7 mmol/L (ref 3.5–5.1)
Sodium: 125 mmol/L — ABNORMAL LOW (ref 135–145)
TOTAL PROTEIN: 5.5 g/dL — AB (ref 6.5–8.1)

## 2015-05-28 LAB — CBC
HCT: 35.7 % — ABNORMAL LOW (ref 40.0–52.0)
Hemoglobin: 12.7 g/dL — ABNORMAL LOW (ref 13.0–18.0)
MCH: 30.6 pg (ref 26.0–34.0)
MCHC: 35.5 g/dL (ref 32.0–36.0)
MCV: 86.3 fL (ref 80.0–100.0)
PLATELETS: 65 10*3/uL — AB (ref 150–440)
RBC: 4.14 MIL/uL — AB (ref 4.40–5.90)
RDW: 13.6 % (ref 11.5–14.5)
WBC: 4 10*3/uL (ref 3.8–10.6)

## 2015-05-28 MED ORDER — GUAIFENESIN ER 600 MG PO TB12
600.0000 mg | ORAL_TABLET | Freq: Two times a day (BID) | ORAL | Status: DC | PRN
Start: 2015-05-28 — End: 2015-05-30
  Administered 2015-05-28 – 2015-05-30 (×2): 600 mg via ORAL
  Filled 2015-05-28 (×3): qty 1

## 2015-05-28 MED ORDER — IPRATROPIUM-ALBUTEROL 0.5-2.5 (3) MG/3ML IN SOLN
3.0000 mL | Freq: Four times a day (QID) | RESPIRATORY_TRACT | Status: DC
  Administered 2015-05-28 – 2015-05-29 (×5): 3 mL via RESPIRATORY_TRACT
  Filled 2015-05-28 (×5): qty 3

## 2015-05-28 MED ORDER — GUAIFENESIN ER 600 MG PO TB12: 600.0000 mg | ORAL_TABLET | Freq: Two times a day (BID) | ORAL | Status: DC | PRN

## 2015-05-28 NOTE — Evaluation (Signed)
Physical Therapy Evaluation Patient Details Name: Derek James MRN: [id number] DOB: 08-29-1930 Today's Date: 05/28/2015   History of Present Illness  82 y.o. male has a past medical history significant for HTN and GERD who presents to ER with 1 week hx of progressive weakness with "achiness", tremor, and fatigue. No N/V. Minimal cough. Low grade fever at home.   Clinical Impression  PT does well with PT exam and though he reports walking slightly slower than his baseline he has no safety issues and is easily able to ambulate around the nurses' station safely.  Pt negotiates steps and shows functional strength, overall pt does well and should be able to go home w/o issue.    Follow Up Recommendations No PT follow up    Equipment Recommendations  None recommended by PT    Recommendations for Other Services       Precautions / Restrictions Precautions Precautions: Fall Restrictions Weight Bearing Restrictions: No      Mobility  Bed Mobility Overal bed mobility: Independent             General bed mobility comments: Pt able to get to EOB w/o issue  Transfers Overall transfer level: Independent               General transfer comment: Pt rises to standing and maintains balance w/o UEs.  Ambulation/Gait Ambulation/Gait assistance: Supervision Ambulation Distance (Feet): 200 Feet Assistive device: None       General Gait Details: Pt does well with ambulation showing no safety issues or LOBs.  He has some mild fatigue but ultimately feels confident and safe with ambulation.   Stairs Stairs: Yes Stairs assistance: Modified independent (Device/Increase time) Stair Management: One rail Right Number of Stairs: 4 General stair comments: Pt able to negotiate up/down steps with reciprocal   Wheelchair Mobility    Modified Rankin (Stroke Patients Only)       Balance Overall balance assessment: Independent                                            Pertinent Vitals/Pain Pain Assessment: No/denies pain    Home Living Family/patient expects to be discharged to:: Private residence Living Arrangements: Alone     Home Access: Stairs to enter Entrance Stairs-Rails: Right Entrance Stairs-Number of Steps: 3          Prior Function Level of Independence: Independent         Comments: Pt able to drive, run all her errands, etc - he states he goes to the gym 4-6x/week     Hand Dominance        Extremity/Trunk Assessment   Upper Extremity Assessment: Overall WFL for tasks assessed           Lower Extremity Assessment: Overall WFL for tasks assessed         Communication   Communication: No difficulties  Cognition Arousal/Alertness: Awake/alert Behavior During Therapy: WFL for tasks assessed/performed Overall Cognitive Status: Within Functional Limits for tasks assessed                      General Comments      Exercises        Assessment/Plan    PT Assessment Patent does not need any further PT services  PT Diagnosis Generalized weakness   PT Problem List    PT Treatment Interventions  PT Goals (Current goals can be found in the Care Plan section) Acute Rehab PT Goals Patient Stated Goal: go home PT Goal Formulation: With patient Time For Goal Achievement: 06/04/15 Potential to Achieve Goals: Good    Frequency     Barriers to discharge        Co-evaluation               End of Session Equipment Utilized During Treatment: Gait belt Activity Tolerance: Patient tolerated treatment well Patient left: with bed alarm set;with call bell/phone within reach           Time: 0850-0908 PT Time Calculation (min) (ACUTE ONLY): 18 min   Charges:   PT Evaluation $PT Eval Low Complexity: 1 Procedure     PT G Codes:       Wayne Both, PT, DPT [phone]  Kreg Shropshire 05/28/2015, 11:35 AM

## 2015-05-28 NOTE — Progress Notes (Signed)
Respiratory called to administer breathing treatment.  

## 2015-05-28 NOTE — Progress Notes (Signed)
Per dining services, no iphone was found.

## 2015-05-28 NOTE — Progress Notes (Signed)
Patient complaints of non productive cough. Prime Doc paged. Dr Doy Hutching notified new orders given.

## 2015-05-28 NOTE — Progress Notes (Signed)
Pateint states his phone is missing, last time he saw it, it was on his food tray.Dining services called, no one is answering for the past 20 minutes

## 2015-05-28 NOTE — Progress Notes (Signed)
Nursing worried about coughing and worsening breathing.  Ordered cough medicine DuoNeb nebulizer breathing treatment and chest x-ray.

## 2015-05-28 NOTE — Progress Notes (Signed)
Patient ID: Derek BAMBRICK, male   DOB: [birth year], 82 y.o.   MRN: [id number] [name]    MR#:  [id number]  DATE OF BIRTH:  1930/06/28  SUBJECTIVE:    REVIEW OF SYSTEMS:   ROS Tolerating Diet: Tolerating PT:   DRUG ALLERGIES:   Allergies  Allergen Reactions  . Dexlansoprazole Diarrhea  . Escitalopram Diarrhea  . Lansoprazole Diarrhea    VITALS:  Blood pressure 122/57, pulse 85, temperature 99.2 F (37.3 C), temperature source Oral, resp. rate 16, height 5\' 6"  (1.676 m), weight 74.481 kg (164 lb 3.2 oz), SpO2 96 %.  PHYSICAL EXAMINATION:   Physical Exam  GENERAL:  81 y.o.-year-old patient lying in the bed with no acute distress.  EYES: Pupils equal, round, reactive to light and accommodation. No scleral icterus. Extraocular muscles intact.  HEENT: Head atraumatic, normocephalic. Oropharynx and nasopharynx clear.  NECK:  Supple, no jugular venous distention. No thyroid enlargement, no tenderness.  LUNGS: Normal breath sounds bilaterally, no wheezing, rales, rhonchi. No use of accessory muscles of respiration.  CARDIOVASCULAR: S1, S2 normal. No murmurs, rubs, or gallops.  ABDOMEN: Soft, nontender, nondistended. Bowel sounds present. No organomegaly or mass.  EXTREMITIES: No cyanosis, clubbing or edema b/l.    NEUROLOGIC: Cranial nerves II through XII are intact. No focal Motor or sensory deficits b/l.   PSYCHIATRIC:  patient is alert and oriented x 3.  SKIN: No obvious rash, lesion, or ulcer.   LABORATORY PANEL:  CBC  Recent Labs Lab 05/28/15 0456  WBC 4.0  HGB 12.7*  HCT 35.7*  PLT 65*    Chemistries   Recent Labs Lab 05/28/15 0456  NA 125*  K 3.7  CL 97*  CO2 22  GLUCOSE 80  BUN 15  CREATININE 0.87  CALCIUM 7.6*  AST 30  ALT 21  ALKPHOS 40  BILITOT 0.8   Cardiac Enzymes  Recent Labs Lab 05/27/15 1246  TROPONINI <0.03   RADIOLOGY:  Dg Chest Portable  1 View  05/27/2015  CLINICAL DATA:  82 year old male with tightness in the neck and back and hyponatremia EXAM: PORTABLE CHEST 1 VIEW COMPARISON:  Prior chest x-ray 11/20/2013 FINDINGS: Portable frontal chest x-ray likely exaggerates the size of the cardiopericardial silhouette. No significant interval change compared to prior. Atherosclerotic calcifications again present in the transverse aorta. The lungs are clear. No pneumothorax or pleural effusion. Stable mild bronchitic change. No acute osseous abnormality. IMPRESSION: No active disease. Electronically Signed   By: Jacqulynn Cadet M.D.   On: 05/27/2015 13:12   ASSESSMENT AND PLAN:  Derek James is a 82 y.o. male has a past medical history significant for HTN and GERD who presents to ER with 1 week hx of progressive weakness with "achiness", tremor, and fatigue.Presented to ER where he was noted to have a sodium=121. Is on HCTZ for HTN chronically  1. Acute hyponatremia suspected due to dehydration and poor by mouth intake since patient has not been eating well for several days. And also due to hydrochlorothiazide. -Patient reports drinking a significant amount of water as well. -IV fluids. -Sodium trending up 121--- 121--- 125 -Discontinued hydrochlorothiazide  2. Hypertension -Continue metoprolol and amlodipine  3. GERD  4. Generalized weakness have physical therapy see patient Patient is very independent at home does not use any cane or walker. He continues to drive.  Case discussed with Care Management/Social Worker. Management plans discussed with the patient and  in  agreement.  CODE STATUS: Full  DFullVT Prophylaxis: Lovenox TOTAL TIME TAKING CARE OF THIS PATIENT: 30 minutes.  >50% time spent on counselling and coordination of care  POSSIBLE D/C IN *1-2 DAYS, DEPENDING ON CLINICAL CONDITION.  Note: This dictation was prepared with Dragon dictation along with smaller phrase technology. Any transcriptional errors that  result from this process are unintentional.  Samuele Storey M.D on 05/28/2015 at 10:53 AM  Between 7am to 6pm - Pager - [phone]  After 6pm go to [email or web address] - password [email or web address] [facility]  [facility] [facility]  Office  [phone]  CC: Primary care physician; Vianne Bulls. Arline Asp, MD

## 2015-05-28 NOTE — Progress Notes (Addendum)
Patient cough non improved with PRN medication. Patient has expiatory wheezes ,SOB, and dyspnea.Patient placed on 2l SPO2. Expressed concerns to nursing supervisor Lelon Frohlich. Ann at bedside to assess patient. Prime Doc paged. Dr. Manuella Ghazi notified placing new orders.

## 2015-05-29 LAB — BASIC METABOLIC PANEL
ANION GAP: 5 (ref 5–15)
BUN: 17 mg/dL (ref 6–20)
CALCIUM: 7.6 mg/dL — AB (ref 8.9–10.3)
CHLORIDE: 100 mmol/L — AB (ref 101–111)
CO2: 20 mmol/L — AB (ref 22–32)
Creatinine, Ser: 0.91 mg/dL (ref 0.61–1.24)
GFR calc non Af Amer: >60 mL/min (ref >60)
Glucose, Bld: 129 mg/dL — ABNORMAL HIGH (ref 65–99)
POTASSIUM: 4.1 mmol/L (ref 3.5–5.1)
Sodium: 125 mmol/L — ABNORMAL LOW (ref 135–145)

## 2015-05-29 LAB — APTT: APTT: 49 s — AB (ref 24–36)

## 2015-05-29 LAB — CBC WITH DIFFERENTIAL/PLATELET
Basophils Absolute: 0 10*3/uL (ref 0–0.1)
Basophils Relative: 0 %
Eosinophils Absolute: 0 10*3/uL (ref 0–0.7)
Eosinophils Relative: 0 %
HCT: 35.7 % — ABNORMAL LOW (ref 40.0–52.0)
Hemoglobin: 12.3 g/dL — ABNORMAL LOW (ref 13.0–18.0)
Lymphocytes Relative: 7 %
Lymphs Abs: 0.6 10*3/uL — ABNORMAL LOW (ref 1.0–3.6)
MCH: 29.6 pg (ref 26.0–34.0)
MCHC: 34.6 g/dL (ref 32.0–36.0)
MCV: 85.7 fL (ref 80.0–100.0)
Monocytes Absolute: 1.5 10*3/uL — ABNORMAL HIGH (ref 0.2–1.0)
Monocytes Relative: 17 %
Neutro Abs: 6.4 10*3/uL (ref 1.4–6.5)
Neutrophils Relative %: 76 %
Platelets: 80 10*3/uL — ABNORMAL LOW (ref 150–440)
RBC: 4.16 MIL/uL — ABNORMAL LOW (ref 4.40–5.90)
RDW: 13.8 % (ref 11.5–14.5)
WBC: 8.5 10*3/uL (ref 3.8–10.6)

## 2015-05-29 LAB — BRAIN NATRIURETIC PEPTIDE: B NATRIURETIC PEPTIDE 5: 706 pg/mL — AB (ref 0.0–100.0)

## 2015-05-29 LAB — PROTIME-INR
INR: 1.51
PROTHROMBIN TIME: 18.3 s — AB (ref 11.4–15.0)

## 2015-05-29 LAB — FIBRIN DEGRADATION PROD.(ARMC ONLY)

## 2015-05-29 MED ORDER — SODIUM CHLORIDE 0.9 % IV SOLN
INTRAVENOUS | Status: DC
  Administered 2015-05-29: 15:00:00 via INTRAVENOUS

## 2015-05-29 MED ORDER — ESOMEPRAZOLE MAGNESIUM 40 MG PO CPDR
40.0000 mg | DELAYED_RELEASE_CAPSULE | Freq: Every day | ORAL | Status: DC
  Administered 2015-05-29: 40 mg via ORAL
  Filled 2015-05-29: qty 1

## 2015-05-29 MED ORDER — IPRATROPIUM-ALBUTEROL 0.5-2.5 (3) MG/3ML IN SOLN
3.0000 mL | Freq: Three times a day (TID) | RESPIRATORY_TRACT | Status: DC
  Administered 2015-05-30: 3 mL via RESPIRATORY_TRACT
  Filled 2015-05-29: qty 3

## 2015-05-29 MED ORDER — LEVOFLOXACIN IN D5W 750 MG/150ML IV SOLN
750.0000 mg | Freq: Every day | INTRAVENOUS | Status: DC
  Administered 2015-05-29 – 2015-05-30 (×2): 750 mg via INTRAVENOUS
  Filled 2015-05-29 (×3): qty 150

## 2015-05-29 MED ORDER — CLINDAMYCIN PHOSPHATE 900 MG/50ML IV SOLN
900.0000 mg | Freq: Three times a day (TID) | INTRAVENOUS | Status: DC
  Administered 2015-05-29: 900 mg via INTRAVENOUS
  Filled 2015-05-29 (×2): qty 50

## 2015-05-29 MED ORDER — ESOMEPRAZOLE MAGNESIUM 40 MG PO CPDR
40.0000 mg | DELAYED_RELEASE_CAPSULE | Freq: Every day | ORAL | Status: DC
  Administered 2015-06-01: 40 mg via ORAL
  Filled 2015-05-29 (×4): qty 1

## 2015-05-29 MED ORDER — CETYLPYRIDINIUM CHLORIDE 0.05 % MT LIQD
7.0000 mL | Freq: Two times a day (BID) | OROMUCOSAL | Status: DC
  Administered 2015-05-29 – 2015-06-02 (×7): 7 mL via OROMUCOSAL

## 2015-05-29 MED ORDER — SODIUM CHLORIDE 0.9 % IV SOLN: Freq: Once | INTRAVENOUS | Status: DC

## 2015-05-29 MED ORDER — NON FORMULARY: 20.0000 mg | Freq: Every day | Status: DC

## 2015-05-29 NOTE — Care Management (Signed)
Spoke with patient and lives alone.PCP Dr. Jimmie Molly. [place]  [phone]. Son lives next door- Derek James. Drives. Has front-wheeled walker at home but has not been using. He states he's "not ready for discharge- not eating or drinking- so weak". He is not sure about home health services and PT is not recommending HHPT. RNCM will continue to follow.

## 2015-05-29 NOTE — Progress Notes (Signed)
Dr Darvin Neighbours called and asked if pt needed a speech consult.  He did not want to order a speech consult at this time

## 2015-05-29 NOTE — Progress Notes (Signed)
Initial Nutrition Assessment  DOCUMENTATION CODES:   Not applicable  INTERVENTION:   -Cater to pt preferences. Pt reports to writer that he is allergic to 'sweets.' Pt reports not tolerating sweetners such as high fructose corn syrup and sugars.  -Recommend liberalizing diet order to regular -Will send afternoon snack of chicken salad and crackers. Pt had peanut butter crackers and nuts brought in by family.   NUTRITION DIAGNOSIS:   Inadequate oral intake related to acute illness as evidenced by per patient/family report.  GOAL:   Patient will meet greater than or equal to 90% of their needs  MONITOR:   PO intake, Labs, Weight trends, Supplement acceptance, I & O's  REASON FOR ASSESSMENT:   Consult Poor PO  ASSESSMENT:   Pt admitted with one week progressive weakness and hyponatremia.   Past Medical History  Diagnosis Date  . Hypertension   . Rhinitis   . GERD (gastroesophageal reflux disease)     Diet Order:  Diet regular Room service appropriate?: Yes; Fluid consistency:: Thin    Pt ate 25% of chicken noodle soup at lunch today with a Sprite Zero. Pt reports poor appetite since admission. Recorded po intake 100% of breakfast and lunch yesterday. Pt reports usually eating 3 meals per day with a good appetite PTA.    Medications: reviewed, Colace  Labs: Na 125, 121, Glucose 129   Gastrointestinal Profile: Last BM:  05/29/2015   Nutrition-Focused Physical Exam Findings: Nutrition-Focused physical exam completed. Findings are no fat depletion, mild-moderate muscle depletion, and no edema.     Weight Change: Pt reports usual body weight in the 150s. Current weight of 167lbs. Pt reports probably losing weight since admission (pt admitted 2 days ago)   Skin:  Reviewed, no issues   Height:   Ht Readings from Last 1 Encounters:  05/27/15 5\' 6"  (1.676 m)    Weight:   Wt Readings from Last 1 Encounters:  05/29/15 167 lb 14.4 oz (76.159 kg)   Wt  Readings from Last 10 Encounters:  05/29/15 167 lb 14.4 oz (76.159 kg)  09/05/14 150 lb (68.04 kg)    BMI:  Body mass index is 27.11 kg/(m^2).  Estimated Nutritional Needs:   Kcal:  1995-2350kcals  Protein:  65-81g protein  Fluid:  >2L fluid  EDUCATION NEEDS:   No education needs identified at this time   Dwyane Luo, RD, LDN Pager [phone] Weekend/On-Call Pager [phone]

## 2015-05-29 NOTE — Progress Notes (Signed)
Patient normally takes his nexium at bedtime. Schedule adjusted in Epic.

## 2015-05-29 NOTE — Care Management Important Message (Signed)
Important Message  Patient Details  Name: Derek James MRN: [id number] Date of Birth: Apr 10, 1930   Medicare Important Message Given:  Yes    [name] 05/29/2015, 10:27 AM

## 2015-05-29 NOTE — Progress Notes (Signed)
Pharmacy Antibiotic Note  Derek James is a 82 y.o. male admitted on 05/27/2015 with pneumonia.  Pharmacy has been consulted for levofloxacin dosing.  Plan: Levofloxacin 750 mg IV Q24H  Height: 5\' 6"  (167.6 cm) Weight: 167 lb 14.4 oz (76.159 kg) IBW/kg (Calculated) : 63.8  Temp (24hrs), Avg:99.4 F (37.4 C), Min:98.1 F (36.7 C), Max:100.5 F (38.1 C)   Recent Labs Lab 05/27/15 1246 05/27/15 1948 05/28/15 0456  WBC 4.4  --  4.0  CREATININE 0.87 0.81 0.87    Estimated Creatinine Clearance: 59.1 mL/min (by C-G formula based on Cr of 0.87).    Allergies  Allergen Reactions  . Dexlansoprazole Diarrhea  . Escitalopram Diarrhea  . Lansoprazole Diarrhea   Thank you for allowing pharmacy to be a part of this patient's care.  Laural Benes, Pharm.D., BCPS Clinical Pharmacist 05/29/2015 7:27 AM

## 2015-05-29 NOTE — Progress Notes (Signed)
Patient ID: Derek James, male   DOB: Nov 18, 1930, 82 y.o.   MRN: [id number] [place] [facility] [place] NAME: Derek James    MR#:  [id number]  DATE OF BIRTH:  1930-08-05  SUBJECTIVE:   Feels weak and no appetite. SOB. desated overnight and placed on O2  REVIEW OF SYSTEMS:   Review of Systems  Constitutional: Positive for fever and malaise/fatigue. Negative for chills.  HENT: Negative for sore throat.   Eyes: Negative for blurred vision, double vision and pain.  Respiratory: Positive for cough and shortness of breath. Negative for hemoptysis and wheezing.   Cardiovascular: Negative for chest pain, palpitations, orthopnea and leg swelling.  Gastrointestinal: Negative for heartburn, nausea, vomiting, abdominal pain, diarrhea and constipation.  Genitourinary: Negative for dysuria and hematuria.  Musculoskeletal: Negative for back pain and joint pain.  Skin: Negative for rash.  Neurological: Positive for weakness. Negative for sensory change, speech change, focal weakness and headaches.  Endo/Heme/Allergies: Does not bruise/bleed easily.  Psychiatric/Behavioral: Negative for depression. The patient is not nervous/anxious.    DRUG ALLERGIES:   Allergies  Allergen Reactions  . Dexlansoprazole Diarrhea  . Escitalopram Diarrhea  . Lansoprazole Diarrhea    VITALS:  Blood pressure 104/55, pulse 79, temperature 98.8 F (37.1 C), temperature source Oral, resp. rate 20, height 5\' 6"  (1.676 m), weight 76.159 kg (167 lb 14.4 oz), SpO2 93 %.  PHYSICAL EXAMINATION:   Physical Exam  GENERAL:  82 y.o.-year-old patient lying in the bed with no acute distress.  EYES: Pupils equal, round, reactive to light and accommodation. No scleral icterus. Extraocular muscles intact.  HEENT: Head atraumatic, normocephalic. Oropharynx and nasopharynx clear.  NECK:  Supple, no jugular venous distention. No thyroid enlargement, no tenderness.  LUNGS:  Left lower rales. Increased work of breathing CARDIOVASCULAR: S1, S2 normal. No murmurs, rubs, or gallops.  ABDOMEN: Soft, nontender, nondistended. Bowel sounds present. No organomegaly or mass.  EXTREMITIES: No cyanosis, clubbing or edema b/l.    NEUROLOGIC: Cranial nerves II through XII are intact. No focal Motor or sensory deficits b/l.   PSYCHIATRIC:  patient is alert and oriented x 3.  SKIN: No obvious rash, lesion, or ulcer.   LABORATORY PANEL:  CBC  Recent Labs Lab 05/28/15 0456  WBC 4.0  HGB 12.7*  HCT 35.7*  PLT 65*    Chemistries   Recent Labs Lab 05/28/15 0456  NA 125*  K 3.7  CL 97*  CO2 22  GLUCOSE 80  BUN 15  CREATININE 0.87  CALCIUM 7.6*  AST 30  ALT 21  ALKPHOS 40  BILITOT 0.8   Cardiac Enzymes  Recent Labs Lab 05/27/15 1246  TROPONINI <0.03   RADIOLOGY:  Dg Chest 2 View  05/28/2015  CLINICAL DATA:  Worsening cough. EXAM: CHEST  2 VIEW COMPARISON:  Portable AP view yesterday. FINDINGS: The heart is prominent in size, unchanged from prior, likely accentuated by AP technique. There are new bibasilar opacities, localizing to the lower lobes on the lateral view. No evidence of pleural effusion or pneumothorax. The bones are under mineralized. IMPRESSION: New bibasilar opacities, may be atelectasis or aspiration given distribution. Pneumonia or pulmonary edema could have a similar appearance, however felt less likely. Electronically Signed   By: Jeb Levering M.D.   On: 05/28/2015 23:47   Dg Chest Portable 1 View  05/27/2015  CLINICAL DATA:  82 year old male with tightness in the neck and back and hyponatremia EXAM: PORTABLE CHEST 1 VIEW COMPARISON:  Prior chest x-ray 11/20/2013 FINDINGS: Portable frontal chest x-ray likely exaggerates the size of the cardiopericardial silhouette. No significant interval change compared to prior. Atherosclerotic calcifications again present in the transverse aorta. The lungs are clear. No pneumothorax or pleural  effusion. Stable mild bronchitic change. No acute osseous abnormality. IMPRESSION: No active disease. Electronically Signed   By: Jacqulynn Cadet M.D.   On: 05/27/2015 13:12   ASSESSMENT AND PLAN:  Derek James is a 82 y.o. male has a past medical history significant for HTN and GERD who presents to ER with 1 week hx of progressive weakness with "achiness", tremor, and fatigue.Presented to ER where he was noted to have a sodium 121. Is on HCTZ for HTN chronically  * Bibasilar pneumonia with acute hypoxic respiratory failure Start IV antibiotics. Check blood cultures. Nebulizers when necessary. Wean oxygen as tolerated.  * Acute hyponatremia  Likely due to poor by mouth intake. Low solute load. Patient was also on hydrochlorothiazide. -Patient reports drinking a significant amount of water as well. -IV fluids. -Sodium trending up 121--- 121--- 125 -Discontinued hydrochlorothiazide - We'll request nephrology consult.  * Acute thrombocytopenia Likely due to infection. Need to rule out DIC. Check PT and APTT. Stop heparin  * Hypertension -Continue metoprolol and amlodipine  * GERD  * Protein calorie malnutrition Consult dietitian.  * Generalized weakness have physical therapy see patient Patient is very independent at home does not use any cane or walker.He continues to drive at baseline.  Case discussed with Care Management/Social Worker. Management plans discussed with the patient and  in agreement.  CODE STATUS: Full  DVT Prophylaxis: SCDs  TOTAL TIME TAKING CARE OF THIS PATIENT: 35 minutes.   POSSIBLE D/C IN 1-2 DAYS, DEPENDING ON CLINICAL CONDITION.  Note: This dictation was prepared with Dragon dictation along with smaller phrase technology. Any transcriptional errors that result from this process are unintentional.  Derek James R M.D on 05/29/2015 at 11:11 AM  Between 7am to 6pm - Pager - [phone]  After 6pm go to [email or web address] - password [email or web address]  [facility]  [place] [facility]  Office  [phone]  CC: Primary care physician; Derek James. Arline Asp, MD

## 2015-05-29 NOTE — Progress Notes (Signed)
Patient resting between care. No acute distress noted. Patient cough non productive. Chest xray complete. Prime doc paged. Dr. Estanislado Pandy notified placing new orders.

## 2015-05-29 NOTE — Consult Note (Signed)
[facility]  CONSULT NOTE    Date: 05/29/2015                  Patient Name:  Derek James  MRN: [id number]  DOB: 07-May-1930  Age / Sex: 82 y.o., male         PCP: Vianne Bulls. Arline Asp, MD                 Service Requesting Consult: Dr. Darvin Neighbours                 Reason for Consult: Hyponatremia            History of Present Illness: [name] is a 82 y.o. white male with GERD, allergic rhinitis, hyeprtension, BPH,  who was admitted to [facility] on 05/27/2015 for Hyponatremia [E87.1]   Patient diagnosed with pneumonia. Originally thought it was allergies. Has no eaten well for several days. Baseline sodium of 138 from 01/19/15.   He takes hydrochlorothiazide as an outpatient.   Started on levofloxacin. Son at bedside.    Medications: Outpatient medications: Prescriptions prior to admission  Medication Sig Dispense Refill Last Dose  . aspirin EC 81 MG tablet Take 81 mg by mouth daily.   05/27/2015 at Unknown time  . esomeprazole (NEXIUM) 20 MG capsule Take 20 mg by mouth daily at 12 noon.   05/27/2015 at Unknown time  . fluticasone (FLONASE) 50 MCG/ACT nasal spray Place 2 sprays into both nostrils daily.  5 05/26/2015 at Unknown time  . hydrochlorothiazide (HYDRODIURIL) 25 MG tablet Take 12.5 mg by mouth daily.    05/26/2015 at Unknown time  . loratadine (CLARITIN) 10 MG tablet Take 10 mg by mouth daily.   05/26/2015 at Unknown time  . tamsulosin (FLOMAX) 0.4 MG CAPS capsule Take 0.4 mg by mouth daily.    05/26/2015 at Unknown time  . zolpidem (AMBIEN) 10 MG tablet Take 5 mg by mouth at bedtime as needed for sleep.   0 05/26/2015 at Unknown time  . Probiotic Product (ALIGN) 4 MG CAPS Take 4 mg by mouth daily.       Current medications: Current Facility-Administered Medications  Medication Dose Route Frequency Provider Last Rate Last Dose  . acetaminophen (TYLENOL) tablet 650 mg  650 mg Oral Q6H PRN Idelle Crouch, MD   650 mg at 05/28/15 [id number]   Or  .  acetaminophen (TYLENOL) suppository 650 mg  650 mg Rectal Q6H PRN Idelle Crouch, MD      . amLODipine (NORVASC) tablet 5 mg  5 mg Oral Daily Idelle Crouch, MD   5 mg at 05/29/15 0912  . antiseptic oral rinse (CPC / CETYLPYRIDINIUM CHLORIDE 0.05%) solution 7 mL  7 mL Mouth Rinse BID Hillary Bow, MD   7 mL at 05/29/15 1000  . bisacodyl (DULCOLAX) suppository 10 mg  10 mg Rectal Daily PRN Idelle Crouch, MD      . docusate sodium (COLACE) capsule 100 mg  100 mg Oral BID Idelle Crouch, MD   100 mg at 05/29/15 0912  . esomeprazole (NEXIUM) capsule 40 mg  40 mg Oral Q1200 Srikar Sudini, MD      . fluticasone (FLONASE) 50 MCG/ACT nasal spray 2 spray  2 spray Each Nare Daily Idelle Crouch, MD   2 spray at 05/28/15 0841  . guaiFENesin (MUCINEX) 12 hr tablet 600 mg  600 mg Oral Q12H PRN Idelle Crouch, MD   600 mg at 05/28/15  2105  . ipratropium-albuterol (DUONEB) 0.5-2.5 (3) MG/3ML nebulizer solution 3 mL  3 mL Nebulization Q6H Vipul Shah, MD   3 mL at 05/29/15 0752  . levofloxacin (LEVAQUIN) IVPB 750 mg  750 mg Intravenous Daily Hillary Bow, MD   750 mg at 05/29/15 0811  . loratadine (CLARITIN) tablet 10 mg  10 mg Oral Daily Idelle Crouch, MD   10 mg at 05/29/15 0912  . metoprolol tartrate (LOPRESSOR) tablet 25 mg  25 mg Oral BID Idelle Crouch, MD   25 mg at 05/29/15 0912  . ondansetron (ZOFRAN) tablet 4 mg  4 mg Oral Q6H PRN Idelle Crouch, MD       Or  . ondansetron [facility]) injection 4 mg  4 mg Intravenous Q6H PRN Idelle Crouch, MD      . tamsulosin [facility]) capsule 0.4 mg  0.4 mg Oral Daily Idelle Crouch, MD   0.4 mg at 05/29/15 0912  . zolpidem (AMBIEN) tablet 5 mg  5 mg Oral QHS PRN Idelle Crouch, MD   5 mg at 05/27/15 2343      Allergies: Allergies  Allergen Reactions  . Dexlansoprazole Diarrhea  . Escitalopram Diarrhea  . Lansoprazole Diarrhea      Past Medical History: Past Medical History  Diagnosis Date  . Hypertension   . Rhinitis   . GERD  (gastroesophageal reflux disease)      Past Surgical History: Past Surgical History  Procedure Laterality Date  . Inner ear surgery    . Hernia repair    . Eye surgery      retinal detatchment  . Colonoscopy with propofol N/A 09/05/2014    Procedure: COLONOSCOPY WITH PROPOFOL;  Surgeon: Manya Silvas, MD;  Location: [facility] ENDOSCOPY;  Service: Endoscopy;  Laterality: N/A;     Family History: Family History  Problem Relation Age of Onset  . Hypertension Mother   . Hypertension Father   . CAD Father      Social History: Social History   Social History  . Marital Status: Widowed    Spouse Name: N/A  . Number of Children: N/A  . Years of Education: N/A   Occupational History  . Not on file.   Social History Main Topics  . Smoking status: Never Smoker   . Smokeless tobacco: Not on file  . Alcohol Use: No  . Drug Use: Not on file  . Sexual Activity: Not on file   Other Topics Concern  . Not on file   Social History Narrative     Review of Systems: Review of Systems  Constitutional: Positive for fever, chills, weight loss, malaise/fatigue and diaphoresis.  HENT: Positive for congestion, hearing loss and sore throat. Negative for ear discharge, ear pain, nosebleeds and tinnitus.   Eyes: Negative.  Negative for blurred vision, double vision, photophobia, pain, discharge and redness.  Respiratory: Positive for cough, sputum production, shortness of breath and wheezing. Negative for hemoptysis and stridor.   Cardiovascular: Negative.  Negative for chest pain, palpitations, orthopnea, claudication, leg swelling and PND.  Gastrointestinal: Positive for heartburn and nausea. Negative for vomiting, abdominal pain, diarrhea, constipation, blood in stool and melena.  Genitourinary: Negative.  Negative for dysuria, urgency, frequency, hematuria and flank pain.  Musculoskeletal: Negative.  Negative for myalgias, back pain, joint pain, falls and neck pain.  Skin: Negative.   Negative for itching and rash.  Neurological: Positive for weakness. Negative for dizziness, tingling, tremors, sensory change, speech change, focal weakness, seizures, loss of  consciousness and headaches.  Endo/Heme/Allergies: Negative.  Negative for environmental allergies and polydipsia. Does not bruise/bleed easily.  Psychiatric/Behavioral: Negative.  Negative for depression, suicidal ideas, hallucinations, memory loss and substance abuse. The patient is not nervous/anxious and does not have insomnia.     Vital Signs: Blood pressure 104/55, pulse 79, temperature 98.8 F (37.1 C), temperature source Oral, resp. rate 20, height 5\' 6"  (1.676 m), weight 76.159 kg (167 lb 14.4 oz), SpO2 93 %.  Weight trends: Filed Weights   05/27/15 1553 05/28/15 0523 05/29/15 0500  Weight: 73.8 kg (162 lb 11.2 oz) 74.481 kg (164 lb 3.2 oz) 76.159 kg (167 lb 14.4 oz)    Physical Exam: General: NAD, laying in bed  Head: Normocephalic, atraumatic. Moist oral mucosal membranes  Eyes: Anicteric, PERRL  Neck: Supple, trachea midline  Lungs:  Bilateral rhonchi  Heart: Regular rate and rhythm  Abdomen:  Soft, nontender,   Extremities: no peripheral edema.  Neurologic: Nonfocal, moving all four extremities  Skin: No lesions        Lab results: Basic Metabolic Panel:  Recent Labs Lab 05/27/15 1246 05/27/15 1948 05/28/15 0456  NA 121* 121* 125*  K 3.8 3.8 3.7  CL 89* 93* 97*  CO2 22 21* 22  GLUCOSE 94 107* 80  BUN 13 16 15   CREATININE 0.87 0.81 0.87  CALCIUM 8.1* 7.4* 7.6*    Liver Function Tests:  Recent Labs Lab 05/27/15 1246 05/28/15 0456  AST 32 30  ALT 24 21  ALKPHOS 52 40  BILITOT 1.1 0.8  PROT 6.6 5.5*  ALBUMIN 3.7 3.0*   No results for input(s): LIPASE, AMYLASE in the last 168 hours. No results for input(s): AMMONIA in the last 168 hours.  CBC:  Recent Labs Lab 05/27/15 1246 05/28/15 0456 05/29/15 1207  WBC 4.4 4.0 8.5  NEUTROABS 3.4  --  6.4  HGB 13.9 12.7*  12.3*  HCT 39.8* 35.7* 35.7*  MCV 85.5 86.3 85.7  PLT 69* 65* 80*    Cardiac Enzymes:  Recent Labs Lab 05/27/15 1246  TROPONINI <0.03    BNP: Invalid input(s): POCBNP  CBG: No results for input(s): GLUCAP in the last 168 hours.  Microbiology: No results found for this or any previous visit.  Coagulation Studies: No results for input(s): LABPROT, INR in the last 72 hours.  Urinalysis:  Recent Labs  05/27/15 1246  COLORURINE YELLOW*  LABSPEC 1.017  PHURINE 6.0  GLUCOSEU NEGATIVE  HGBUR 1+*  BILIRUBINUR NEGATIVE  KETONESUR 1+*  PROTEINUR NEGATIVE  NITRITE NEGATIVE  LEUKOCYTESUR NEGATIVE      Imaging: Dg Chest 2 View  05/28/2015  CLINICAL DATA:  Worsening cough. EXAM: CHEST  2 VIEW COMPARISON:  Portable AP view yesterday. FINDINGS: The heart is prominent in size, unchanged from prior, likely accentuated by AP technique. There are new bibasilar opacities, localizing to the lower lobes on the lateral view. No evidence of pleural effusion or pneumothorax. The bones are under mineralized. IMPRESSION: New bibasilar opacities, may be atelectasis or aspiration given distribution. Pneumonia or pulmonary edema could have a similar appearance, however felt less likely. Electronically Signed   By: Jeb Levering M.D.   On: 05/28/2015 23:47      Assessment & Plan: Mr. ERON TRICARICO is a 82 y.o. white male with GERD, allergic rhinitis, hyeprtension, BPH,  who was admitted to [facility] on 05/27/2015 for Hyponatremia [E87.1]   1. Hyponatremia: concern for acute SIADH versus hypovolemic hyponatremia. Given IV fluids. Now off. Holding hydrochlorothiazide.  No sodium  for today. Get labs for today. May need to restart IV fluids if necessary.   2. Hypertension: blood pressure at goal. Holding hydrochlorothiazide.  - amlodipine, tamsulosin and metorpolol.   3. Pneumonia:  - levofloxacin - Supportive care.   4. Thrombocytopenia: could be acute however low platelets in 2015.     LOS: [place], Abby Stines 4/17/201712:41 PM

## 2015-05-30 ENCOUNTER — Inpatient Hospital Stay (HOSPITAL_COMMUNITY)
Admit: 2015-05-30 | Discharge: 2015-05-30 | Disposition: A | Discharge status: Home / Self Care | Payer: Medicare Other | Attending: Internal Medicine | Admitting: Internal Medicine

## 2015-05-30 ENCOUNTER — Inpatient Hospital Stay: Payer: Medicare Other

## 2015-05-30 DIAGNOSIS — I5021 Acute systolic (congestive) heart failure: Secondary | ICD-10-CM

## 2015-05-30 LAB — BASIC METABOLIC PANEL
Anion gap: 7 (ref 5–15)
BUN: 20 mg/dL (ref 6–20)
CALCIUM: 7.6 mg/dL — AB (ref 8.9–10.3)
CO2: 19 mmol/L — ABNORMAL LOW (ref 22–32)
CREATININE: 0.9 mg/dL (ref 0.61–1.24)
Chloride: 101 mmol/L (ref 101–111)
GFR calc Af Amer: >60 mL/min (ref >60)
GLUCOSE: 112 mg/dL — AB (ref 65–99)
POTASSIUM: 4 mmol/L (ref 3.5–5.1)
Sodium: 127 mmol/L — ABNORMAL LOW (ref 135–145)

## 2015-05-30 LAB — ECHOCARDIOGRAM COMPLETE
HEIGHTINCHES: 66 in
Weight: 2690.42 oz

## 2015-05-30 LAB — CBC WITH DIFFERENTIAL/PLATELET
Basophils Absolute: 0 10*3/uL (ref 0–0.1)
Basophils Relative: 0 %
Eosinophils Absolute: 0 10*3/uL (ref 0–0.7)
HCT: 36.8 % — ABNORMAL LOW (ref 40.0–52.0)
Hemoglobin: 12.7 g/dL — ABNORMAL LOW (ref 13.0–18.0)
LYMPHS ABS: 0.7 10*3/uL — AB (ref 1.0–3.6)
MCH: 30.2 pg (ref 26.0–34.0)
MCHC: 34.6 g/dL (ref 32.0–36.0)
MCV: 87.3 fL (ref 80.0–100.0)
MONO ABS: 1.4 10*3/uL — AB (ref 0.2–1.0)
Monocytes Relative: 17 %
Neutro Abs: 5.8 10*3/uL (ref 1.4–6.5)
Neutrophils Relative %: 74 %
PLATELETS: 95 10*3/uL — AB (ref 150–440)
RBC: 4.22 MIL/uL — AB (ref 4.40–5.90)
RDW: 13.8 % (ref 11.5–14.5)
WBC: 7.9 10*3/uL (ref 3.8–10.6)

## 2015-05-30 MED ORDER — IPRATROPIUM-ALBUTEROL 0.5-2.5 (3) MG/3ML IN SOLN
3.0000 mL | RESPIRATORY_TRACT | Status: DC | PRN
  Administered 2015-05-30: 3 mL via RESPIRATORY_TRACT
  Filled 2015-05-30: qty 3

## 2015-05-30 MED ORDER — METHYLPREDNISOLONE SODIUM SUCC 125 MG IJ SOLR
60.0000 mg | INTRAMUSCULAR | Status: DC
  Administered 2015-05-30 – 2015-05-31 (×2): 60 mg via INTRAVENOUS
  Filled 2015-05-30 (×2): qty 2

## 2015-05-30 MED ORDER — BENZONATATE 100 MG PO CAPS
200.0000 mg | ORAL_CAPSULE | Freq: Three times a day (TID) | ORAL | Status: DC | PRN
  Administered 2015-05-31 (×2): 200 mg via ORAL
  Filled 2015-05-30 (×4): qty 2

## 2015-05-30 MED ORDER — TRAMADOL HCL 50 MG PO TABS: 50.0000 mg | ORAL_TABLET | Freq: Four times a day (QID) | ORAL | Status: DC | PRN

## 2015-05-30 MED ORDER — FUROSEMIDE 10 MG/ML IJ SOLN
40.0000 mg | Freq: Two times a day (BID) | INTRAMUSCULAR | Status: DC
  Administered 2015-05-30 – 2015-06-01 (×4): 40 mg via INTRAVENOUS
  Filled 2015-05-30 (×4): qty 4

## 2015-05-30 MED ORDER — GUAIFENESIN-CODEINE 100-10 MG/5ML PO SOLN: 5.0000 mL | ORAL | Status: DC | PRN

## 2015-05-30 MED ORDER — FUROSEMIDE 10 MG/ML IJ SOLN
40.0000 mg | Freq: Once | INTRAMUSCULAR | Status: AC
  Administered 2015-05-30: 40 mg via INTRAVENOUS
  Filled 2015-05-30: qty 4

## 2015-05-30 NOTE — Progress Notes (Signed)
Called RT for breathing treatment

## 2015-05-30 NOTE — Progress Notes (Signed)
[facility]  ROUNDING NOTE   Subjective:   Stopped IV fluids and started on furosemide.  Son at bedside.  Na 127.   Objective:  Vital signs in last 24 hours:  Temp:  [98.3 F (36.8 C)-99.3 F (37.4 C)] 98.8 F (37.1 C) (04/18 0826) Pulse Rate:  [81-102] 97 (04/18 0826) Resp:  [18-32] 32 (04/18 0826) BP: (114-132)/(56-66) 132/63 mmHg (04/18 0826) SpO2:  [94 %-96 %] 94 % (04/18 0826) Weight:  [76.273 kg (168 lb 2.4 oz)] 76.273 kg (168 lb 2.4 oz) (04/18 0500)  Weight change: 0.114 kg (4 oz) Filed Weights   05/28/15 0523 05/29/15 0500 05/30/15 0500  Weight: 74.481 kg (164 lb 3.2 oz) 76.159 kg (167 lb 14.4 oz) 76.273 kg (168 lb 2.4 oz)    Intake/Output: I/O last 3 completed shifts: In: 170 [P.O.:170] Out: 125 [Urine:125]   Intake/Output this shift:  Total I/O In: -  Out: 600 [Urine:600]  Physical Exam: General: NAD,   Head: Normocephalic, atraumatic. Moist oral mucosal membranes  Eyes: Anicteric, PERRL  Neck: Supple, trachea midline  Lungs:  Clear to auscultation  Heart: Regular rate and rhythm  Abdomen:  Soft, nontender,   Extremities: + peripheral edema.  Neurologic: Nonfocal, moving all four extremities  Skin: No lesions       Basic Metabolic Panel:  Recent Labs Lab 05/27/15 1246 05/27/15 1948 05/28/15 0456 05/29/15 1207 05/30/15 0634  NA 121* 121* 125* 125* 127*  K 3.8 3.8 3.7 4.1 4.0  CL 89* 93* 97* 100* 101  CO2 22 21* 22 20* 19*  GLUCOSE 94 107* 80 129* 112*  BUN 13 16 15 17 20   CREATININE 0.87 0.81 0.87 0.91 0.90  CALCIUM 8.1* 7.4* 7.6* 7.6* 7.6*    Liver Function Tests:  Recent Labs Lab 05/27/15 1246 05/28/15 0456  AST 32 30  ALT 24 21  ALKPHOS 52 40  BILITOT 1.1 0.8  PROT 6.6 5.5*  ALBUMIN 3.7 3.0*   No results for input(s): LIPASE, AMYLASE in the last 168 hours. No results for input(s): AMMONIA in the last 168 hours.  CBC:  Recent Labs Lab 05/27/15 1246 05/28/15 0456 05/29/15 1207 05/30/15 0634  WBC 4.4  4.0 8.5 7.9  NEUTROABS 3.4  --  6.4 5.8  HGB 13.9 12.7* 12.3* 12.7*  HCT 39.8* 35.7* 35.7* 36.8*  MCV 85.5 86.3 85.7 87.3  PLT 69* 65* 80* 95*    Cardiac Enzymes:  Recent Labs Lab 05/27/15 1246  TROPONINI <0.03    BNP: Invalid input(s): POCBNP  CBG: No results for input(s): GLUCAP in the last 168 hours.  Microbiology: Results for orders placed or performed during the hospital encounter of 05/27/15  CULTURE, BLOOD (ROUTINE X 2) w Reflex to PCR ID Panel     Status: None (Preliminary result)   Collection Time: 05/29/15 10:40 AM  Result Value Ref Range Status   Specimen Description BLOOD RIGHT AC  Final   Special Requests   Final    BOTTLES DRAWN AEROBIC AND ANAEROBIC AER 9ML ANA 6ML   Culture NO GROWTH < 24 HOURS  Final   Report Status PENDING  Incomplete  CULTURE, BLOOD (ROUTINE X 2) w Reflex to PCR ID Panel     Status: None (Preliminary result)   Collection Time: 05/29/15 10:41 AM  Result Value Ref Range Status   Specimen Description BLOOD RIGHT HAN  Final   Special Requests   Final    BOTTLES DRAWN AEROBIC AND ANAEROBIC AER 9ML ANA 8ML   Culture  NO GROWTH < 24 HOURS  Final   Report Status PENDING  Incomplete    Coagulation Studies:  Recent Labs  05/29/15 1207  LABPROT 18.3*  INR 1.51    Urinalysis:  Recent Labs  05/27/15 1246  COLORURINE YELLOW*  LABSPEC 1.017  PHURINE 6.0  GLUCOSEU NEGATIVE  HGBUR 1+*  BILIRUBINUR NEGATIVE  KETONESUR 1+*  PROTEINUR NEGATIVE  NITRITE NEGATIVE  LEUKOCYTESUR NEGATIVE      Imaging: Dg Chest 2 View  05/30/2015  CLINICAL DATA:  Evaluate hypoxia EXAM: CHEST  2 VIEW COMPARISON:  Two days ago FINDINGS: Progressive interstitial opacity with asymmetric left apical airspace disease. Small bilateral pleural effusion. No pneumothorax. Stable borderline cardiomegaly with negative aortic contours. IMPRESSION: 1. New left upper lobe pneumonia or atelectasis. 2. Pulmonary venous congestion and small pleural effusions.  Electronically Signed   By: Monte Fantasia M.D.   On: 05/30/2015 09:55   Dg Chest 2 View  05/28/2015  CLINICAL DATA:  Worsening cough. EXAM: CHEST  2 VIEW COMPARISON:  Portable AP view yesterday. FINDINGS: The heart is prominent in size, unchanged from prior, likely accentuated by AP technique. There are new bibasilar opacities, localizing to the lower lobes on the lateral view. No evidence of pleural effusion or pneumothorax. The bones are under mineralized. IMPRESSION: New bibasilar opacities, may be atelectasis or aspiration given distribution. Pneumonia or pulmonary edema could have a similar appearance, however felt less likely. Electronically Signed   By: Jeb Levering M.D.   On: 05/28/2015 23:47     Medications:     . amLODipine  5 mg Oral Daily  . antiseptic oral rinse  7 mL Mouth Rinse BID  . docusate sodium  100 mg Oral BID  . esomeprazole  40 mg Oral Q1200  . fluticasone  2 spray Each Nare Daily  . furosemide  40 mg Intravenous BID  . levofloxacin (LEVAQUIN) IV  750 mg Intravenous Daily  . loratadine  10 mg Oral Daily  . methylPREDNISolone (SOLU-MEDROL) injection  60 mg Intravenous Q24H  . metoprolol tartrate  25 mg Oral BID  . tamsulosin  0.4 mg Oral Daily   acetaminophen **OR** acetaminophen, benzonatate, bisacodyl, ipratropium-albuterol, ondansetron **OR** ondansetron (ZOFRAN) IV, traMADol, zolpidem  Assessment/ Plan:  [name] is a 82 y.o. white male with GERD, allergic rhinitis, hyeprtension, BPH, who was admitted to [facility] on 05/27/2015 for Hyponatremia [E87.1]   1. Hyponatremia: concerning with congestive heart failure. Holding hydrochlorothiazide.  - furosemide, continue to monitor volume status - fluid restriction.  - serial sodium levels.   2. Hypertension: blood pressure at goal. Holding hydrochlorothiazide.  - amlodipine, tamsulosin and metorpolol.  - echocardiogram ordered  3. Pneumonia:  - levofloxacin - Supportive care.   4.  Thrombocytopenia: could be acute however low platelets in 2015.    LOS: 3 Lori Popowski 4/18/201712:00 PM

## 2015-05-30 NOTE — Progress Notes (Signed)
Patient ID: Derek James, male   DOB: 05/13/1930, 82 y.o.   MRN: [id number] [facility] [place] NAME: Derek James    MR#:  [id number]  DATE OF BIRTH:  [birth year]  SUBJECTIVE:   Feels weak and no appetite. SOB worse. Cough, dry. Lower chest pain with coughing.  REVIEW OF SYSTEMS:   Review of Systems  Constitutional: Positive for fever and malaise/fatigue. Negative for chills.  HENT: Negative for sore throat.   Eyes: Negative for blurred vision, double vision and pain.  Respiratory: Positive for cough and shortness of breath. Negative for hemoptysis and wheezing.   Cardiovascular: Negative for chest pain, palpitations, orthopnea and leg swelling.  Gastrointestinal: Negative for heartburn, nausea, vomiting, abdominal pain, diarrhea and constipation.  Genitourinary: Negative for dysuria and hematuria.  Musculoskeletal: Negative for back pain and joint pain.  Skin: Negative for rash.  Neurological: Positive for weakness. Negative for sensory change, speech change, focal weakness and headaches.  Endo/Heme/Allergies: Does not bruise/bleed easily.  Psychiatric/Behavioral: Negative for depression. The patient is not nervous/anxious.    DRUG ALLERGIES:   Allergies  Allergen Reactions  . Dexlansoprazole Diarrhea  . Escitalopram Diarrhea  . Lansoprazole Diarrhea    VITALS:  Blood pressure 132/63, pulse 97, temperature 98.8 F (37.1 C), temperature source Oral, resp. rate 32, height 5\' 6"  (1.676 m), weight 76.273 kg (168 lb 2.4 oz), SpO2 94 %.  PHYSICAL EXAMINATION:   Physical Exam  GENERAL:  82 y.o.-year-old patient lying in the bed with respiratory distress. Decreased hearing EYES: Pupils equal, round, reactive to light and accommodation. No scleral icterus. Extraocular muscles intact.  HEENT: Head atraumatic, normocephalic. Oropharynx and nasopharynx clear.  NECK:  Supple, no jugular venous distention. No thyroid  enlargement, no tenderness.  LUNGS:Bilateral crackles and increased work of breathing CARDIOVASCULAR: S1, S2 normal. No murmurs, rubs, or gallops.  ABDOMEN: Soft, nontender, nondistended. Bowel sounds present. No organomegaly or mass.  EXTREMITIES: No cyanosis, clubbing or edema b/l.    NEUROLOGIC: Cranial nerves II through XII are intact. No focal Motor or sensory deficits b/l.   PSYCHIATRIC:  patient is alert and oriented x 3.  SKIN: No obvious rash, lesion, or ulcer.   LABORATORY PANEL:  CBC  Recent Labs Lab 05/30/15 0634  WBC 7.9  HGB 12.7*  HCT 36.8*  PLT 95*    Chemistries   Recent Labs Lab 05/28/15 0456  05/30/15 0634  NA 125*  < > 127*  K 3.7  < > 4.0  CL 97*  < > 101  CO2 22  < > 19*  GLUCOSE 80  < > 112*  BUN 15  < > 20  CREATININE 0.87  < > 0.90  CALCIUM 7.6*  < > 7.6*  AST 30  --   --   ALT 21  --   --   ALKPHOS 40  --   --   BILITOT 0.8  --   --   < > = values in this interval not displayed. Cardiac Enzymes  Recent Labs Lab 05/27/15 1246  TROPONINI <0.03   RADIOLOGY:  Dg Chest 2 View  05/28/2015  CLINICAL DATA:  Worsening cough. EXAM: CHEST  2 VIEW COMPARISON:  Portable AP view yesterday. FINDINGS: The heart is prominent in size, unchanged from prior, likely accentuated by AP technique. There are new bibasilar opacities, localizing to the lower lobes on the lateral view. No evidence of pleural effusion or pneumothorax. The bones are under mineralized. IMPRESSION:  New bibasilar opacities, may be atelectasis or aspiration given distribution. Pneumonia or pulmonary edema could have a similar appearance, however felt less likely. Electronically Signed   By: Jeb Levering M.D.   On: 05/28/2015 23:47   ASSESSMENT AND PLAN:  Derek James is a 82 y.o. male has a past medical history significant for HTN and GERD who presents to ER with 1 week hx of progressive weakness with "achiness", tremor, and fatigue.Presented to ER where he was noted to have a  sodium 121. Is on HCTZ for HTN chronically  * Acute hypoxic respiratory failure -  worsening Likely combination of pneumonia and new CHF Crackles on exam and elevated BNP. Stop IVF. STAT dose of lasix 40mg  IV Duoneb Also start steroids as patient had wheezing earlier  * Bibasilar pneumonia Started IV antibiotics. Check blood cultures. Nebulizers  Wean oxygen as tolerated.  * Acute hyponatremia  Likely due to poor by mouth intake. Low solute load. Patient was also on hydrochlorothiazide. -Patient reports drinking a significant amount of water as well. -IV fluids stopped due to concern for CHF -Sodium trending up 121--- 121--- 125 -Discontinued hydrochlorothiazide - nephrology on board  * Acute thrombocytopenia - Improving Likely due to infection. Not DIC/TTP Stopped heparin  * Hypertension -Continue metoprolol and amlodipine  * GERD  * Protein calorie malnutrition Consulted dietitian.  * Generalized weakness have physical therapy see patient Patient is very independent at home does not use any cane or walker.He continues to drive at baseline.  Case discussed with Care Management/Social Worker. Management plans discussed with the patient and  in agreement.  CODE STATUS: Full  DVT Prophylaxis: SCDs  TOTAL CC TIME TAKING CARE OF THIS PATIENT: 35 minutes.   Note: This dictation was prepared with Dragon dictation along with smaller phrase technology. Any transcriptional errors that result from this process are unintentional.  Hillary Bow R M.D on 05/30/2015 at 8:52 AM  Between 7am to 6pm - Pager - [phone]  After 6pm go to [email or web address] - password [email or web address] [facility]  [facility]  Office  [phone]  CC: Primary care physician; Vianne Bulls. Arline Asp, MD

## 2015-05-30 NOTE — Progress Notes (Signed)
Pastoral Care and prayer provided.  °

## 2015-05-31 ENCOUNTER — Encounter: Payer: Self-pay | Admitting: Physician Assistant

## 2015-05-31 DIAGNOSIS — R0603 Acute respiratory distress: Secondary | ICD-10-CM | POA: Diagnosis not present

## 2015-05-31 DIAGNOSIS — E871 Hypo-osmolality and hyponatremia: Principal | ICD-10-CM

## 2015-05-31 DIAGNOSIS — I4891 Unspecified atrial fibrillation: Secondary | ICD-10-CM

## 2015-05-31 DIAGNOSIS — J168 Pneumonia due to other specified infectious organisms: Secondary | ICD-10-CM

## 2015-05-31 DIAGNOSIS — R0600 Dyspnea, unspecified: Secondary | ICD-10-CM

## 2015-05-31 DIAGNOSIS — I5033 Acute on chronic diastolic (congestive) heart failure: Secondary | ICD-10-CM | POA: Diagnosis not present

## 2015-05-31 LAB — CBC WITH DIFFERENTIAL/PLATELET
Basophils Absolute: 0 10*3/uL (ref 0–0.1)
Basophils Relative: 0 %
Eosinophils Absolute: 0 10*3/uL (ref 0–0.7)
Eosinophils Relative: 0 %
HEMATOCRIT: 36.3 % — AB (ref 40.0–52.0)
Hemoglobin: 12.7 g/dL — ABNORMAL LOW (ref 13.0–18.0)
LYMPHS ABS: 0.4 10*3/uL — AB (ref 1.0–3.6)
LYMPHS PCT: 5 %
MCH: 29.9 pg (ref 26.0–34.0)
MCHC: 35 g/dL (ref 32.0–36.0)
MCV: 85.4 fL (ref 80.0–100.0)
MONO ABS: 0.7 10*3/uL (ref 0.2–1.0)
MONOS PCT: 8 %
NEUTROS ABS: 7.4 10*3/uL — AB (ref 1.4–6.5)
Neutrophils Relative %: 87 %
Platelets: 123 10*3/uL — ABNORMAL LOW (ref 150–440)
RBC: 4.25 MIL/uL — ABNORMAL LOW (ref 4.40–5.90)
RDW: 13.8 % (ref 11.5–14.5)
WBC: 8.5 10*3/uL (ref 3.8–10.6)

## 2015-05-31 LAB — BASIC METABOLIC PANEL
Anion gap: 7 (ref 5–15)
BUN: 25 mg/dL — ABNORMAL HIGH (ref 6–20)
CALCIUM: 7.7 mg/dL — AB (ref 8.9–10.3)
CO2: 24 mmol/L (ref 22–32)
CREATININE: 0.87 mg/dL (ref 0.61–1.24)
Chloride: 96 mmol/L — ABNORMAL LOW (ref 101–111)
GFR calc Af Amer: >60 mL/min (ref >60)
GFR calc non Af Amer: >60 mL/min (ref >60)
GLUCOSE: 151 mg/dL — AB (ref 65–99)
Potassium: 3.5 mmol/L (ref 3.5–5.1)
Sodium: 127 mmol/L — ABNORMAL LOW (ref 135–145)

## 2015-05-31 LAB — ALBUMIN: Albumin: 2.7 g/dL — ABNORMAL LOW (ref 3.5–5.0)

## 2015-05-31 MED ORDER — RIVAROXABAN 20 MG PO TABS
20.0000 mg | ORAL_TABLET | Freq: Every day | ORAL | Status: DC
  Administered 2015-05-31 – 2015-06-01 (×2): 20 mg via ORAL
  Filled 2015-05-31 (×2): qty 1

## 2015-05-31 MED ORDER — DILTIAZEM HCL ER COATED BEADS 120 MG PO CP24
120.0000 mg | ORAL_CAPSULE | Freq: Every day | ORAL | Status: DC
  Filled 2015-05-31: qty 1

## 2015-05-31 MED ORDER — POTASSIUM CHLORIDE CRYS ER 20 MEQ PO TBCR
20.0000 meq | EXTENDED_RELEASE_TABLET | Freq: Two times a day (BID) | ORAL | Status: DC
  Administered 2015-05-31 – 2015-06-02 (×5): 20 meq via ORAL
  Filled 2015-05-31 (×5): qty 1

## 2015-05-31 MED ORDER — DILTIAZEM HCL ER COATED BEADS 120 MG PO CP24: 120.0000 mg | ORAL_CAPSULE | Freq: Every day | ORAL | Status: DC

## 2015-05-31 MED ORDER — PREDNISONE 20 MG PO TABS
40.0000 mg | ORAL_TABLET | Freq: Every day | ORAL | Status: DC
  Administered 2015-06-01 – 2015-06-02 (×2): 40 mg via ORAL
  Filled 2015-05-31 (×2): qty 2

## 2015-05-31 MED ORDER — LEVOFLOXACIN 750 MG PO TABS
750.0000 mg | ORAL_TABLET | Freq: Every day | ORAL | Status: DC
  Administered 2015-05-31 – 2015-06-02 (×3): 750 mg via ORAL
  Filled 2015-05-31 (×3): qty 1

## 2015-05-31 NOTE — Care Management Important Message (Signed)
Important Message  Patient Details  Name: Derek James MRN: [id number] Date of Birth: Mar 18, 1930   Medicare Important Message Given:  Yes    Juliann Pulse A Seanpaul Preece 05/31/2015, 10:53 AM

## 2015-05-31 NOTE — Progress Notes (Signed)
[facility]  ROUNDING NOTE   Subjective:   Patient sitting up in chair.  Na 127  UOP 2928 - furosemide 40mg  IV.   Objective:  Vital signs in last 24 hours:  Temp:  [97.9 F (36.6 C)-98.1 F (36.7 C)] 97.9 F (36.6 C) (04/19 0748) Pulse Rate:  [80-93] 80 (04/19 0748) Resp:  [18-34] 18 (04/19 0748) BP: (103-124)/(48-62) 105/58 mmHg (04/19 0748) SpO2:  [92 %-96 %] 95 % (04/19 0748) Weight:  [74.318 kg (163 lb 13.5 oz)] 74.318 kg (163 lb 13.5 oz) (04/19 0500)  Weight change: -1.955 kg (-4 lb 5 oz) Filed Weights   05/29/15 0500 05/30/15 0500 05/31/15 0500  Weight: 76.159 kg (167 lb 14.4 oz) 76.273 kg (168 lb 2.4 oz) 74.318 kg (163 lb 13.5 oz)    Intake/Output: I/O last 3 completed shifts: In: 220 [P.O.:120; IV Piggyback:100] Out: 2928 [Urine:2928]   Intake/Output this shift:  Total I/O In: 240 [P.O.:240] Out: -   Physical Exam: General: NAD,   Head: Normocephalic, atraumatic. Moist oral mucosal membranes  Eyes: Anicteric, PERRL  Neck: Supple, trachea midline  Lungs:  Clear to auscultation  Heart: Regular rate and rhythm  Abdomen:  Soft, nontender,   Extremities: + peripheral edema.  Neurologic: Nonfocal, moving all four extremities  Skin: No lesions       Basic Metabolic Panel:  Recent Labs Lab 05/27/15 1948 05/28/15 0456 05/29/15 1207 05/30/15 0634 05/31/15 0545  NA 121* 125* 125* 127* 127*  K 3.8 3.7 4.1 4.0 3.5  CL 93* 97* 100* 101 96*  CO2 21* 22 20* 19* 24  GLUCOSE 107* 80 129* 112* 151*  BUN 16 15 17 20  25*  CREATININE 0.81 0.87 0.91 0.90 0.87  CALCIUM 7.4* 7.6* 7.6* 7.6* 7.7*    Liver Function Tests:  Recent Labs Lab 05/27/15 1246 05/28/15 0456 05/31/15 0545  AST 32 30  --   ALT 24 21  --   ALKPHOS 52 40  --   BILITOT 1.1 0.8  --   PROT 6.6 5.5*  --   ALBUMIN 3.7 3.0* 2.7*   No results for input(s): LIPASE, AMYLASE in the last 168 hours. No results for input(s): AMMONIA in the last 168 hours.  CBC:  Recent  Labs Lab 05/27/15 1246 05/28/15 0456 05/29/15 1207 05/30/15 0634 05/31/15 0545  WBC 4.4 4.0 8.5 7.9 8.5  NEUTROABS 3.4  --  6.4 5.8 7.4*  HGB 13.9 12.7* 12.3* 12.7* 12.7*  HCT 39.8* 35.7* 35.7* 36.8* 36.3*  MCV 85.5 86.3 85.7 87.3 85.4  PLT 69* 65* 80* 95* 123*    Cardiac Enzymes:  Recent Labs Lab 05/27/15 1246  TROPONINI <0.03    BNP: Invalid input(s): POCBNP  CBG: No results for input(s): GLUCAP in the last 168 hours.  Microbiology: Results for orders placed or performed during the hospital encounter of 05/27/15  CULTURE, BLOOD (ROUTINE X 2) w Reflex to PCR ID Panel     Status: None (Preliminary result)   Collection Time: 05/29/15 10:40 AM  Result Value Ref Range Status   Specimen Description BLOOD RIGHT AC  Final   Special Requests   Final    BOTTLES DRAWN AEROBIC AND ANAEROBIC AER 9ML ANA 6ML   Culture NO GROWTH 2 DAYS  Final   Report Status PENDING  Incomplete  CULTURE, BLOOD (ROUTINE X 2) w Reflex to PCR ID Panel     Status: None (Preliminary result)   Collection Time: 05/29/15 10:41 AM  Result Value Ref Range Status  Specimen Description BLOOD RIGHT HAN  Final   Special Requests   Final    BOTTLES DRAWN AEROBIC AND ANAEROBIC AER 9ML ANA 8ML   Culture NO GROWTH 2 DAYS  Final   Report Status PENDING  Incomplete    Coagulation Studies:  Recent Labs  05/29/15 1207  LABPROT 18.3*  INR 1.51    Urinalysis: No results for input(s): COLORURINE, LABSPEC, PHURINE, GLUCOSEU, HGBUR, BILIRUBINUR, KETONESUR, PROTEINUR, UROBILINOGEN, NITRITE, LEUKOCYTESUR in the last 72 hours.  Invalid input(s): APPERANCEUR    Imaging: Dg Chest 2 View  05/30/2015  CLINICAL DATA:  Evaluate hypoxia EXAM: CHEST  2 VIEW COMPARISON:  Two days ago FINDINGS: Progressive interstitial opacity with asymmetric left apical airspace disease. Small bilateral pleural effusion. No pneumothorax. Stable borderline cardiomegaly with negative aortic contours. IMPRESSION: 1. New left upper lobe  pneumonia or atelectasis. 2. Pulmonary venous congestion and small pleural effusions. Electronically Signed   By: Monte Fantasia M.D.   On: 05/30/2015 09:55     Medications:     . antiseptic oral rinse  7 mL Mouth Rinse BID  . [START ON 06/01/2015] diltiazem  120 mg Oral Daily  . docusate sodium  100 mg Oral BID  . esomeprazole  40 mg Oral Q1200  . fluticasone  2 spray Each Nare Daily  . furosemide  40 mg Intravenous BID  . levofloxacin  750 mg Oral Daily  . loratadine  10 mg Oral Daily  . methylPREDNISolone (SOLU-MEDROL) injection  60 mg Intravenous Q24H  . metoprolol tartrate  25 mg Oral BID  . potassium chloride  20 mEq Oral BID  . rivaroxaban  20 mg Oral Q supper  . tamsulosin  0.4 mg Oral Daily   acetaminophen **OR** acetaminophen, benzonatate, bisacodyl, ipratropium-albuterol, ondansetron **OR** ondansetron (ZOFRAN) IV, traMADol, zolpidem  Assessment/ Plan:  [name] is a 82 y.o. white male with GERD, allergic rhinitis, hyeprtension, BPH, who was admitted to [facility] on 05/27/2015 for Hyponatremia [E87.1]   1. Hyponatremia: concerning with congestive heart failure. Holding hydrochlorothiazide.  - furosemide, continue to monitor volume status - fluid restriction.  - serial sodium levels.   2. Hypertension: blood pressure at goal. Holding hydrochlorothiazide.  - amlodipine, tamsulosin and metorpolol.  - echocardiogram reviewed.   3. Pneumonia:  - levofloxacin - Supportive care.   4. Thrombocytopenia: could be acute however low platelets in 2015. Improving.    LOS: Ettrick, Jardine 4/19/201710:56 AM

## 2015-05-31 NOTE — Consult Note (Signed)
Cardiology Consultation Note  Patient ID: Derek James, MRN: [id number], DOB/AGE: 05/08/1930 82 y.o. Admit date: 05/27/2015   Date of Consult: 05/31/2015 Primary Physician: Vianne Bulls. Arline Asp, MD Primary Cardiologist: New to [facility]  Chief Complaint: Generalized weakness Reason for Consult: New onset Afib/acute diastolic CHF  HPI: 82 y.o. male with h/o HTN, left-sided unilateral deafness, BPH, GERD, right-sided cholesteatoma, right-sided mastoidectomy, and retinal detachment who presented to [facility] on 05/27/15 with generalized weakness and was found to have hyponatremic on HCTZ. He subsequently developed a PNA while inpatient followed by new onset Afib with RVR and was found to have acute on chronic diastolic CHf along with profound hypoalbuminemia. Cardiology is consulted for new onset Afib with RVR along with CHF.  He has no previously known cardiac history and has never had a cardiac catheterization, stress test, or echo prior to this admission. He was in his usual state of health up until several days prior to his admission when he developed generalized weakness. No falls, dizziness, presyncope, or syncope. No chest pain, palpitations, nausea, vomiting, or diaphoresis. He was taken to [facility] ED where he was found to have a Na of 121. His HCTZ was held and he was started on IV fluids. His BNP was found to be 706 two days into his admission. He developed acute respiratory distress requiring nasal cannula and was found to have newly found PNA on CXR 4/16, confirmed on CXR on 4/18 that showed new left upper lobe PNA. On the evening of 4/18 at 5:44 PM he developed new onset Afib with heart rates in the 70's to 90's. He does not feel any palpitations. Echo on 4/18 showed an EF of 50-55%, rhythm c/w Afib, study not technically sufficient to allow for Lv diastolic function, mild AI, aortic root moderately dilated, moderate MR, LA normal in size, RV systolic function normal, pASP 44 mmHg. He has been started on IV  Lasix with a UOP of only 349 mL for the admission, uncertain if this is accurate or not. Of note, upon admission he was noted to have an albumin of 3.0 and a total protein of 5.5. Admission EKG on 4/15 showed NSR, 88 bpm, left anterior fascicular block, nonspecific lateral st/t changes.   Past Medical History  Diagnosis Date  . Hypertension   . Rhinitis   . GERD (gastroesophageal reflux disease)       Most Recent Cardiac Studies: As above   Surgical History:  Past Surgical History  Procedure Laterality Date  . Inner ear surgery    . Hernia repair    . Eye surgery      retinal detatchment  . Colonoscopy with propofol N/A 09/05/2014    Procedure: COLONOSCOPY WITH PROPOFOL;  Surgeon: Manya Silvas, MD;  Location: [facility] ENDOSCOPY;  Service: Endoscopy;  Laterality: N/A;     Home Meds: Prior to Admission medications   Medication Sig Start Date End Date Taking? Authorizing Provider  aspirin EC 81 MG tablet Take 81 mg by mouth daily.   Yes Historical Provider, MD  esomeprazole (NEXIUM) 20 MG capsule Take 20 mg by mouth daily at 12 noon.   Yes Historical Provider, MD  fluticasone (FLONASE) 50 MCG/ACT nasal spray Place 2 sprays into both nostrils daily. 05/08/15  Yes Historical Provider, MD  hydrochlorothiazide (HYDRODIURIL) 25 MG tablet Take 12.5 mg by mouth daily.    Yes Historical Provider, MD  loratadine (CLARITIN) 10 MG tablet Take 10 mg by mouth daily.   Yes Historical Provider, MD  tamsulosin (  FLOMAX) 0.4 MG CAPS capsule Take 0.4 mg by mouth daily.    Yes Historical Provider, MD  zolpidem (AMBIEN) 10 MG tablet Take 5 mg by mouth at bedtime as needed for sleep.  05/22/15  Yes Historical Provider, MD  Probiotic Product (ALIGN) 4 MG CAPS Take 4 mg by mouth daily. 10/24/14   Historical Provider, MD    Inpatient Medications:  . amLODipine  5 mg Oral Daily  . antiseptic oral rinse  7 mL Mouth Rinse BID  . docusate sodium  100 mg Oral BID  . esomeprazole  40 mg Oral Q1200  . fluticasone   2 spray Each Nare Daily  . furosemide  40 mg Intravenous BID  . levofloxacin (LEVAQUIN) IV  750 mg Intravenous Daily  . loratadine  10 mg Oral Daily  . methylPREDNISolone (SOLU-MEDROL) injection  60 mg Intravenous Q24H  . metoprolol tartrate  25 mg Oral BID  . tamsulosin  0.4 mg Oral Daily      Allergies:  Allergies  Allergen Reactions  . Dexlansoprazole Diarrhea  . Escitalopram Diarrhea  . Lansoprazole Diarrhea    Social History   Social History  . Marital Status: Widowed    Spouse Name: N/A  . Number of Children: N/A  . Years of Education: N/A   Occupational History  . Not on file.   Social History Main Topics  . Smoking status: Never Smoker   . Smokeless tobacco: Not on file  . Alcohol Use: No  . Drug Use: Not on file  . Sexual Activity: Not on file   Other Topics Concern  . Not on file   Social History Narrative     Family History  Problem Relation Age of Onset  . Hypertension Mother   . Hypertension Father   . CAD Father      Review of Systems: Review of Systems  Constitutional: Positive for weight loss and malaise/fatigue. Negative for fever, chills and diaphoresis.  HENT: Positive for hearing loss. Negative for congestion.   Eyes: Negative for discharge and redness.  Respiratory: Positive for cough, sputum production, shortness of breath and wheezing. Negative for hemoptysis.   Cardiovascular: Negative for chest pain, palpitations, orthopnea, claudication, leg swelling and PND.  Gastrointestinal: Negative for nausea, vomiting, abdominal pain, blood in stool and melena.  Genitourinary: Negative for hematuria.  Musculoskeletal: Negative for myalgias and falls.  Skin: Negative for rash.  Neurological: Positive for weakness. Negative for dizziness, tingling, tremors, sensory change, speech change, focal weakness and loss of consciousness.  Endo/Heme/Allergies: Does not bruise/bleed easily.  Psychiatric/Behavioral: Negative for substance abuse. The  patient is not nervous/anxious.   All other systems reviewed and are negative.   Labs: No results for input(s): CKTOTAL, CKMB, TROPONINI in the last 72 hours. Lab Results  Component Value Date   WBC 8.5 05/31/2015   HGB 12.7* 05/31/2015   HCT 36.3* 05/31/2015   MCV 85.4 05/31/2015   PLT 123* 05/31/2015    Recent Labs Lab 05/28/15 0456  05/31/15 0545  NA 125*  < > 127*  K 3.7  < > 3.5  CL 97*  < > 96*  CO2 22  < > 24  BUN 15  < > 25*  CREATININE 0.87  < > 0.87  CALCIUM 7.6*  < > 7.7*  PROT 5.5*  --   --   BILITOT 0.8  --   --   ALKPHOS 40  --   --   ALT 21  --   --  AST 30  --   --   GLUCOSE 80  < > 151*  < > = values in this interval not displayed. No results found for: CHOL, HDL, LDLCALC, TRIG No results found for: DDIMER  Radiology/Studies:  Dg Chest 2 View  05/30/2015  CLINICAL DATA:  Evaluate hypoxia EXAM: CHEST  2 VIEW COMPARISON:  Two days ago FINDINGS: Progressive interstitial opacity with asymmetric left apical airspace disease. Small bilateral pleural effusion. No pneumothorax. Stable borderline cardiomegaly with negative aortic contours. IMPRESSION: 1. New left upper lobe pneumonia or atelectasis. 2. Pulmonary venous congestion and small pleural effusions. Electronically Signed   By: Monte Fantasia M.D.   On: 05/30/2015 09:55   Dg Chest 2 View  05/28/2015  CLINICAL DATA:  Worsening cough. EXAM: CHEST  2 VIEW COMPARISON:  Portable AP view yesterday. FINDINGS: The heart is prominent in size, unchanged from prior, likely accentuated by AP technique. There are new bibasilar opacities, localizing to the lower lobes on the lateral view. No evidence of pleural effusion or pneumothorax. The bones are under mineralized. IMPRESSION: New bibasilar opacities, may be atelectasis or aspiration given distribution. Pneumonia or pulmonary edema could have a similar appearance, however felt less likely. Electronically Signed   By: Jeb Levering M.D.   On: 05/28/2015 23:47   Dg  Chest Portable 1 View  05/27/2015  CLINICAL DATA:  82 year old male with tightness in the neck and back and hyponatremia EXAM: PORTABLE CHEST 1 VIEW COMPARISON:  Prior chest x-ray 11/20/2013 FINDINGS: Portable frontal chest x-ray likely exaggerates the size of the cardiopericardial silhouette. No significant interval change compared to prior. Atherosclerotic calcifications again present in the transverse aorta. The lungs are clear. No pneumothorax or pleural effusion. Stable mild bronchitic change. No acute osseous abnormality. IMPRESSION: No active disease. Electronically Signed   By: Jacqulynn Cadet M.D.   On: 05/27/2015 13:12    EKG: NSR, 88 bpm, left anterior fascicular block, nonspecific lateral st/t changes  Weights: Filed Weights   05/29/15 0500 05/30/15 0500 05/31/15 0500  Weight: 167 lb 14.4 oz (76.159 kg) 168 lb 2.4 oz (76.273 kg) 163 lb 13.5 oz (74.318 kg)     Physical Exam: Blood pressure 103/48, pulse 87, temperature 98.1 F (36.7 C), temperature source Oral, resp. rate 34, height 5\' 6"  (1.676 m), weight 163 lb 13.5 oz (74.318 kg), SpO2 96 %. Body mass index is 26.46 kg/(m^2). General: Well developed, well nourished, in no acute distress. Head: Normocephalic, atraumatic, sclera non-icteric, no xanthomas, nares are without discharge. HOH.  Neck: Negative for carotid bruits. JVD not elevated. Lungs: Coarse breath sounds throughout with rhonchi along left upper and middle lobes. Crackles along bilateral bases. Breathing is unlabored. On nasal cannula at 2 L.  Heart: Irregularly-irregular, with S1 S2. No murmurs, rubs, or gallops appreciated. Abdomen: Soft, non-tender, non-distended with normoactive bowel sounds. No hepatomegaly. No rebound/guarding. No obvious abdominal masses. Msk:  Strength and tone appear normal for age. Extremities: No clubbing or cyanosis. No edema.  Distal pedal pulses are 2+ and equal bilaterally. Neuro: Alert and oriented X 3. No facial asymmetry. No focal  deficit. Moves all extremities spontaneously. Psych:  Responds to questions appropriately with a normal affect.    Assessment and Plan:   1. Acute respiratory distress with hypoxia requiring nasal cannula: -Likely multifactorial including his left upper lobe PNA, acute on chronic diastolic CHF, third spacing from hypoalbuminemia, and new onset Afib -Currently on nasal cannula via 2 L, wean as able  2. New onset Afib: -Likely  in the setting of his left upper lobe PNA and volume overload/third spacing  -Currently rate controlled in the mid 80's to 90's bpm -Change amlodipine to Cardizem CD 120 mg daily -Continue Lopressor 25 mg bid -Has not received any full-dose anticoagulation  -It appears he went into Afib at 5:44 Pm on 4/18 -Continue with rate control at this time, as he is unlikely to hold sinus rhythm given his PNA and vascular congestion  -Once he has received 3 weeks of full-dose anticoagulation, should he remain in Afib at that time, could plan for chemical vs DCCV -CHADS2VASc at least 4 (CHF, HTN, age x 2) -Start Xarelto 20 mg q dinner  3. Acute on chronic diastolic CHF: -Was on NaCl infusion from admission until 4/17 -BNP elevated at 706, right-sided pressures elevated at 44 mm Hg, cannot exclude some component of systolic dysfunction in the setting of the patient's new onset Afib, though it does not appear he has been in this rhythm long to lead to tachy-mediated cardiomyopathy -IV Lasix 40 mg bid, added KCl repletion this morning -Will check a magnesium  -Continue Lopressor  -Limit fluids  4. Left upper lobe PNA with sepsis: -Likely driving #2 -On ABX per IM -Nebs and steroids per IM -Consider replacing albuterol with Xopenex   5. Hypoalbuminemia: -Presented with an albumin of 3.0 and a total protein of 5.5 -Recheck levels this morning -Likely contributing to his above acute respiratory distress and pulmonary vascular congestion via third spacing  -Consider IV  albumin  -Treat underlying etiology   Signed, Christell Faith, PA-C Pager: [phone] 05/31/2015, 7:34 AM

## 2015-05-31 NOTE — Progress Notes (Signed)
Patient ID: Derek James, male   DOB: [birth year], 82 y.o.   MRN: [id number] [place] [place] NAME: Derek James    MR#:  [id number]  DATE OF BIRTH:  1930/04/21  SUBJECTIVE:   SOB better today. Dry cough. Poor appetite Still on 2 L oxygen Atrial fibrillation with tachycardia overnight  REVIEW OF SYSTEMS:   Review of Systems  Constitutional: Positive for fever and malaise/fatigue. Negative for chills.  HENT: Negative for sore throat.   Eyes: Negative for blurred vision, double vision and pain.  Respiratory: Positive for cough and shortness of breath. Negative for hemoptysis and wheezing.   Cardiovascular: Negative for chest pain, palpitations, orthopnea and leg swelling.  Gastrointestinal: Negative for heartburn, nausea, vomiting, abdominal pain, diarrhea and constipation.  Genitourinary: Negative for dysuria and hematuria.  Musculoskeletal: Negative for back pain and joint pain.  Skin: Negative for rash.  Neurological: Positive for weakness. Negative for sensory change, speech change, focal weakness and headaches.  Endo/Heme/Allergies: Does not bruise/bleed easily.  Psychiatric/Behavioral: Negative for depression. The patient is not nervous/anxious.    DRUG ALLERGIES:   Allergies  Allergen Reactions  . Dexlansoprazole Diarrhea  . Escitalopram Diarrhea  . Lansoprazole Diarrhea    VITALS:  Blood pressure 105/58, pulse 80, temperature 97.9 F (36.6 C), temperature source Oral, resp. rate 18, height 5\' 6"  (1.676 m), weight 74.318 kg (163 lb 13.5 oz), SpO2 95 %.  PHYSICAL EXAMINATION:   Physical Exam  GENERAL:  82 y.o.-year-old patient lying in the bed with mild conversational dyspnea. Decreased hearing EYES: Pupils equal, round, reactive to light and accommodation. No scleral icterus. Extraocular muscles intact.  HEENT: Head atraumatic, normocephalic. Oropharynx and nasopharynx clear.  NECK:  Supple, no  jugular venous distention. No thyroid enlargement, no tenderness.  LUNGS:Bilateral crackles . Good air entry CARDIOVASCULAR: S1, S2 normal. No murmurs, rubs, or gallops.  ABDOMEN: Soft, nontender, nondistended. Bowel sounds present. No organomegaly or mass.  EXTREMITIES: No cyanosis, clubbing or edema b/l.    NEUROLOGIC: Cranial nerves II through XII are intact. No focal Motor or sensory deficits b/l.   PSYCHIATRIC:  patient is alert and oriented x 3.  SKIN: No obvious rash, lesion, or ulcer.   LABORATORY PANEL:  CBC  Recent Labs Lab 05/31/15 0545  WBC 8.5  HGB 12.7*  HCT 36.3*  PLT 123*    Chemistries   Recent Labs Lab 05/28/15 0456  05/31/15 0545  NA 125*  < > 127*  K 3.7  < > 3.5  CL 97*  < > 96*  CO2 22  < > 24  GLUCOSE 80  < > 151*  BUN 15  < > 25*  CREATININE 0.87  < > 0.87  CALCIUM 7.6*  < > 7.7*  AST 30  --   --   ALT 21  --   --   ALKPHOS 40  --   --   BILITOT 0.8  --   --   < > = values in this interval not displayed. Cardiac Enzymes  Recent Labs Lab 05/27/15 1246  TROPONINI <0.03   RADIOLOGY:  Dg Chest 2 View  05/30/2015  CLINICAL DATA:  Evaluate hypoxia EXAM: CHEST  2 VIEW COMPARISON:  Two days ago FINDINGS: Progressive interstitial opacity with asymmetric left apical airspace disease. Small bilateral pleural effusion. No pneumothorax. Stable borderline cardiomegaly with negative aortic contours. IMPRESSION: 1. New left upper lobe pneumonia or atelectasis. 2. Pulmonary venous congestion and small pleural effusions.  Electronically Signed   By: Monte Fantasia M.D.   On: 05/30/2015 09:55   ASSESSMENT AND PLAN:  Derek James is a 82 y.o. male has a past medical history significant for HTN and GERD who presents to ER with 1 week hx of progressive weakness with "achiness", tremor, and fatigue.Presented to ER where he was noted to have a sodium 121. Is on HCTZ for HTN chronically  * Acute hypoxic respiratory failure  combination of pneumonia and new  CHF Wean oxygen as tolerated  * Acute on chronic diastolic CHF Likely decompensated to do pneumonia - IV Lasix, Beta blockers - Input and Output - Counseled to limit fluids and Salt - Monitor Bun/Cr and Potassium - Echo - EF 50% with Moderate MR. -Cardiology consulted and appreciate input.  * New onset atrial fib Likely due to pneumonia and moderate mitral regurgitation On metoprolol. Amlodipine changed to Cardizem. xarelto.  * bilateral pneumonia levaquin. Check blood cultures. Nebulizers  Wean oxygen as tolerated.  * hypervolemic hyponatremia  - IV Lasix Likely due to poor by mouth intake. Low solute load. Patient was also on hydrochlorothiazide. -Patient reports drinking a significant amount of water as well. -IV fluids stopped due to concern for CHF -Sodium trending up 121--- 121--- 125 -Discontinued hydrochlorothiazide - nephrology on board  * Acute thrombocytopenia - Improving Likely due to infection. Not DIC/TTP Stopped heparin  * Hypertension -Continue metoprolol and amlodipine  * GERD  * Protein calorie malnutrition Consulted dietitian.  * Generalized weakness have physical therapy see patient Patient is very independent at home does not use any cane or walker.He continues to drive at baseline.  Case discussed with Care Management/Social Worker. Management plans discussed with the patient and  in agreement.  Discussed with patient and son at bedside in detail regarding congestive heart failure and atrial fibrillation. All questions answered.  CODE STATUS: Full  DVT Prophylaxis: SCDs  TOTAL TIME TAKING CARE OF THIS PATIENT: 40 minutes.  >50% time spent in discussing with patient and son at bedside  Note: This dictation was prepared with Dragon dictation along with smaller phrase technology. Any transcriptional errors that result from this process are unintentional.  Hillary Bow R M.D on 05/31/2015 at 11:36 AM  Between 7am to 6pm - Pager -  [phone]  After 6pm go to [email or web address] - password [email or web address] [facility]  [facility]  Office  [phone]  CC: Primary care physician; Vianne Bulls. Arline Asp, MD

## 2015-05-31 NOTE — Progress Notes (Signed)
Pharmacy Antibiotic Note  Derek James is a 82 y.o. male admitted on 05/27/2015 with pneumonia.  Pharmacy  consulted for levofloxacin dosing.  Plan: Patient qualifes for IV to PO transition therefore will change to levofloxacin 750 mg PO q24 hours.   Height: 5\' 6"  (167.6 cm) Weight: 163 lb 13.5 oz (74.318 kg) IBW/kg (Calculated) : 63.8  Temp (24hrs), Avg:98 F (36.7 C), Min:97.9 F (36.6 C), Max:98.1 F (36.7 C)   Recent Labs Lab 05/27/15 1246 05/27/15 1948 05/28/15 0456 05/29/15 1207 05/30/15 0634 05/31/15 0545  WBC 4.4  --  4.0 8.5 7.9 8.5  CREATININE 0.87 0.81 0.87 0.91 0.90 0.87    Estimated Creatinine Clearance: 59.1 mL/min (by C-G formula based on Cr of 0.87).    Allergies  Allergen Reactions  . Dexlansoprazole Diarrhea  . Escitalopram Diarrhea  . Lansoprazole Diarrhea   Thank you for allowing pharmacy to be a part of this patient's care.  Jenia Klepper D, Pharm.D., BCPS Clinical Pharmacist 05/31/2015 8:48 AM

## 2015-05-31 NOTE — Progress Notes (Signed)
Tele called, reports 2.9 second pause. Dr Darvin Neighbours notified. BP 104/53, HR 86. Continue to monitor.

## 2015-05-31 NOTE — Progress Notes (Signed)
Per Dr. Ellyn Hack cardiologist, hold diltiazem until 1200pm today.

## 2015-05-31 NOTE — Progress Notes (Signed)
ANTICOAGULATION CONSULT NOTE - Initial Consult  Pharmacy Consult for Xarelto Indication: atrial fibrillation  Allergies  Allergen Reactions  . Dexlansoprazole Diarrhea  . Escitalopram Diarrhea  . Lansoprazole Diarrhea    Patient Measurements: Height: 5\' 6"  (167.6 cm) Weight: 163 lb 13.5 oz (74.318 kg) IBW/kg (Calculated) : 63.8 Heparin Dosing Weight:   Vital Signs: Temp: 97.9 F (36.6 C) (04/19 0748) Temp Source: Oral (04/19 0748) BP: 105/58 mmHg (04/19 0748) Pulse Rate: 80 (04/19 0748)  Labs:  Recent Labs  05/29/15 1207 05/30/15 0634 05/31/15 0545  HGB 12.3* 12.7* 12.7*  HCT 35.7* 36.8* 36.3*  PLT 80* 95* 123*  APTT 49*  --   --   LABPROT 18.3*  --   --   INR 1.51  --   --   CREATININE 0.91 0.90 0.87    Estimated Creatinine Clearance: 59.1 mL/min (by C-G formula based on Cr of 0.87).   Medical History: Past Medical History  Diagnosis Date  . Hypertension   . Rhinitis   . GERD (gastroesophageal reflux disease)   . A-fib [facility])     a. new onset 05/2015  . Chronic diastolic CHF (congestive heart failure) ([place])     a. echo 05/2015: Ef 50-55%, rhythm Afib, moderately dilated aortic root, mild AI, moderate MR, LA normal in size, PASP 44 mmHg    Medications:  Prescriptions prior to admission  Medication Sig Dispense Refill Last Dose  . aspirin EC 81 MG tablet Take 81 mg by mouth daily.   05/27/2015 at Unknown time  . esomeprazole (NEXIUM) 20 MG capsule Take 20 mg by mouth daily at 12 noon.   05/27/2015 at Unknown time  . fluticasone (FLONASE) 50 MCG/ACT nasal spray Place 2 sprays into both nostrils daily.  5 05/26/2015 at Unknown time  . hydrochlorothiazide (HYDRODIURIL) 25 MG tablet Take 12.5 mg by mouth daily.    05/26/2015 at Unknown time  . loratadine (CLARITIN) 10 MG tablet Take 10 mg by mouth daily.   05/26/2015 at Unknown time  . tamsulosin (FLOMAX) 0.4 MG CAPS capsule Take 0.4 mg by mouth daily.    05/26/2015 at Unknown time  . zolpidem (AMBIEN) 10 MG tablet  Take 5 mg by mouth at bedtime as needed for sleep.   0 05/26/2015 at Unknown time  . Probiotic Product (ALIGN) 4 MG CAPS Take 4 mg by mouth daily.      Scheduled:  . antiseptic oral rinse  7 mL Mouth Rinse BID  . [START ON 06/01/2015] diltiazem  120 mg Oral Daily  . docusate sodium  100 mg Oral BID  . esomeprazole  40 mg Oral Q1200  . fluticasone  2 spray Each Nare Daily  . furosemide  40 mg Intravenous BID  . levofloxacin  750 mg Oral Daily  . loratadine  10 mg Oral Daily  . methylPREDNISolone (SOLU-MEDROL) injection  60 mg Intravenous Q24H  . metoprolol tartrate  25 mg Oral BID  . potassium chloride  20 mEq Oral BID  . rivaroxaban  20 mg Oral Q supper  . tamsulosin  0.4 mg Oral Daily    Assessment: Pharmacy consulted to dose and monitor xarelto in this new onset atrial fibrillation patient Goal of Therapy:      Plan:  Will start Xarelto 20 mg PO daily.   Armandina Iman D 05/31/2015,10:06 AM

## 2015-06-01 ENCOUNTER — Encounter: Payer: Self-pay | Admitting: Physician Assistant

## 2015-06-01 ENCOUNTER — Inpatient Hospital Stay: Payer: Medicare Other

## 2015-06-01 LAB — COMPREHENSIVE METABOLIC PANEL
ALT: 104 U/L — AB (ref 17–63)
AST: 98 U/L — ABNORMAL HIGH (ref 15–41)
Albumin: 2.8 g/dL — ABNORMAL LOW (ref 3.5–5.0)
Alkaline Phosphatase: 51 U/L (ref 38–126)
Anion gap: 6 (ref 5–15)
BUN: 35 mg/dL — AB (ref 6–20)
CALCIUM: 8.3 mg/dL — AB (ref 8.9–10.3)
CHLORIDE: 100 mmol/L — AB (ref 101–111)
CO2: 27 mmol/L (ref 22–32)
CREATININE: 1.04 mg/dL (ref 0.61–1.24)
Glucose, Bld: 133 mg/dL — ABNORMAL HIGH (ref 65–99)
Potassium: 4.1 mmol/L (ref 3.5–5.1)
Sodium: 133 mmol/L — ABNORMAL LOW (ref 135–145)
TOTAL PROTEIN: 5.7 g/dL — AB (ref 6.5–8.1)
Total Bilirubin: 1.2 mg/dL (ref 0.3–1.2)

## 2015-06-01 LAB — MAGNESIUM: MAGNESIUM: 2.1 mg/dL (ref 1.7–2.4)

## 2015-06-01 MED ORDER — FUROSEMIDE 20 MG PO TABS
20.0000 mg | ORAL_TABLET | Freq: Two times a day (BID) | ORAL | Status: DC
  Administered 2015-06-01 – 2015-06-02 (×2): 20 mg via ORAL
  Filled 2015-06-01 (×2): qty 1

## 2015-06-01 NOTE — Progress Notes (Signed)
TC to PT, per PT ambulated without O2, sats at 95%, will ambulate again without O2

## 2015-06-01 NOTE — Progress Notes (Signed)
Patient: Derek James / Admit Date: 05/27/2015 / Date of Encounter: 06/01/2015, 9:04 AM   Subjective: SOB improving. No palpitations. Remains in rate controlled Afib with heart rates in the 90-100 bpm range. Diltiazem was held this morning given reported 2.9 second pause, though this is not seen on telemetry. Remains on Lopressor 25 mg bid.   Review of Systems: Review of Systems  Constitutional: Positive for weight loss and malaise/fatigue. Negative for fever, chills and diaphoresis.  HENT: Negative for congestion.   Eyes: Negative for discharge and redness.  Respiratory: Positive for cough and shortness of breath. Negative for hemoptysis, sputum production and wheezing.        Improved  Cardiovascular: Negative for chest pain, palpitations, orthopnea, claudication, leg swelling and PND.  Gastrointestinal: Negative for nausea and vomiting.  Musculoskeletal: Negative for myalgias and falls.  Skin: Negative for rash.  Neurological: Positive for weakness. Negative for dizziness, tingling, tremors, sensory change, speech change, focal weakness and loss of consciousness.  Endo/Heme/Allergies: Does not bruise/bleed easily.  Psychiatric/Behavioral: Negative for substance abuse. The patient is not nervous/anxious.      Objective: Telemetry: Afib, 90's to low 100's bpm Physical Exam: Blood pressure 109/58, pulse 78, temperature 97.9 F (36.6 C), temperature source Oral, resp. rate 18, height 5\' 6"  (1.676 m), weight 159 lb 14.4 oz (72.53 kg), SpO2 94 %. Body mass index is 25.82 kg/(m^2). General: Well developed, well nourished, in no acute distress. Head: Normocephalic, atraumatic, sclera non-icteric, no xanthomas, nares are without discharge. Neck: Negative for carotid bruits. JVP not elevated. Lungs: Improved breath sounds bilaterally. Breathing is unlabored. On nasal cannula.  Heart: irregularly-irregular, S1 S2 without murmurs, rubs, or gallops.  Abdomen: Soft, non-tender,  non-distended with normoactive bowel sounds. No rebound/guarding. Extremities: No clubbing or cyanosis. No edema. Distal pedal pulses are 2+ and equal bilaterally. Neuro: Alert and oriented X 3. Moves all extremities spontaneously. Psych:  Responds to questions appropriately with a normal affect.   Intake/Output Summary (Last 24 hours) at 06/01/15 0904 Last data filed at 06/01/15 0845  Gross per 24 hour  Intake    240 ml  Output    750 ml  Net   -510 ml    Inpatient Medications:  . antiseptic oral rinse  7 mL Mouth Rinse BID  . docusate sodium  100 mg Oral BID  . esomeprazole  40 mg Oral Q1200  . fluticasone  2 spray Each Nare Daily  . furosemide  40 mg Intravenous BID  . levofloxacin  750 mg Oral Daily  . loratadine  10 mg Oral Daily  . metoprolol tartrate  25 mg Oral BID  . potassium chloride  20 mEq Oral BID  . predniSONE  40 mg Oral Q breakfast  . rivaroxaban  20 mg Oral Q supper  . tamsulosin  0.4 mg Oral Daily   Infusions:    Labs:  Recent Labs  05/31/15 0545 06/01/15 0658  NA 127* 133*  K 3.5 4.1  CL 96* 100*  CO2 24 27  GLUCOSE 151* 133*  BUN 25* 35*  CREATININE 0.87 1.04  CALCIUM 7.7* 8.3*    Recent Labs  05/31/15 0545 06/01/15 0658  AST  --  98*  ALT  --  104*  ALKPHOS  --  51  BILITOT  --  1.2  PROT  --  5.7*  ALBUMIN 2.7* 2.8*    Recent Labs  05/30/15 0634 05/31/15 0545  WBC 7.9 8.5  NEUTROABS 5.8 7.4*  HGB 12.7* 12.7*  HCT 36.8* 36.3*  MCV 87.3 85.4  PLT 95* 123*   No results for input(s): CKTOTAL, CKMB, TROPONINI in the last 72 hours. Invalid input(s): POCBNP No results for input(s): HGBA1C in the last 72 hours.   Weights: Filed Weights   05/30/15 0500 05/31/15 0500 06/01/15 0500  Weight: 168 lb 2.4 oz (76.273 kg) 163 lb 13.5 oz (74.318 kg) 159 lb 14.4 oz (72.53 kg)     Radiology/Studies:  Dg Chest 2 View  05/30/2015  CLINICAL DATA:  Evaluate hypoxia EXAM: CHEST  2 VIEW COMPARISON:  Two days ago FINDINGS: Progressive  interstitial opacity with asymmetric left apical airspace disease. Small bilateral pleural effusion. No pneumothorax. Stable borderline cardiomegaly with negative aortic contours. IMPRESSION: 1. New left upper lobe pneumonia or atelectasis. 2. Pulmonary venous congestion and small pleural effusions. Electronically Signed   By: Monte Fantasia M.D.   On: 05/30/2015 09:55   Dg Chest 2 View  05/28/2015  CLINICAL DATA:  Worsening cough. EXAM: CHEST  2 VIEW COMPARISON:  Portable AP view yesterday. FINDINGS: The heart is prominent in size, unchanged from prior, likely accentuated by AP technique. There are new bibasilar opacities, localizing to the lower lobes on the lateral view. No evidence of pleural effusion or pneumothorax. The bones are under mineralized. IMPRESSION: New bibasilar opacities, may be atelectasis or aspiration given distribution. Pneumonia or pulmonary edema could have a similar appearance, however felt less likely. Electronically Signed   By: Jeb Levering M.D.   On: 05/28/2015 23:47   Dg Chest Portable 1 View  05/27/2015  CLINICAL DATA:  82 year old male with tightness in the neck and back and hyponatremia EXAM: PORTABLE CHEST 1 VIEW COMPARISON:  Prior chest x-ray 11/20/2013 FINDINGS: Portable frontal chest x-ray likely exaggerates the size of the cardiopericardial silhouette. No significant interval change compared to prior. Atherosclerotic calcifications again present in the transverse aorta. The lungs are clear. No pneumothorax or pleural effusion. Stable mild bronchitic change. No acute osseous abnormality. IMPRESSION: No active disease. Electronically Signed   By: Jacqulynn Cadet M.D.   On: 05/27/2015 13:12     Assessment and Plan   1. Acute respiratory distress with hypoxia requiring nasal cannula: -Likely multifactorial including his left upper lobe PNA, acute on chronic diastolic CHF, third spacing from hypoalbuminemia, and new onset Afib -Currently on nasal cannula via 1.5  L, wean as able  2. New onset Afib: -Likely in the setting of his left upper lobe PNA and volume overload/third spacing  -Currently rate controlled in the mid 80's to 90's bpm -Cardizem held this morning given reported 2.9 second pause, asymptomatic, not seen on tele -Continue Lopressor 25 mg bid  -Heart rate is well controlled given his PNA  -Previously not on full-dose anticoagulation as an outpatient  -It appears he went into Afib at 5:44 Pm on 4/18 -Continue with rate control at this time, as he is unlikely to hold sinus rhythm given his PNA and vascular congestion  -Once he has received 3 weeks of full-dose anticoagulation, should he remain in Afib at that time, could plan for chemical vs DCCV -CHADS2VASc at least 4 (CHF, HTN, age x 2) -Continue Xarelto 20 mg q dinner  3. Acute on chronic diastolic CHF: -Was on NaCl infusion from admission until 4/17 -BNP elevated at 706, right-sided pressures elevated at 44 mm Hg, cannot exclude some component of systolic dysfunction in the setting of the patient's new onset Afib, though it does not appear he has been in this rhythm long to  lead to tachy-mediated cardiomyopathy -Change IV Lasix 40 mg bid to PO lasix 20 mg bid, continue KCl repletion -Will check a magnesium  -Continue Lopressor  -Limit fluids  4. Left upper lobe PNA with sepsis: -Likely driving #2 -On ABX per IM -Nebs and steroids per IM -Consider replacing albuterol with Xopenex   5. Hypoalbuminemia: -Has had decreased appetite for past several weeks given stressors at home, likely leading to his decreased albumin and hyponatremia  -Presented with an albumin of 3.0 and a total protein of 5.5 -Likely contributing to his above acute respiratory distress and pulmonary vascular congestion via third spacing  -Per IM/nutrition  -Treat underlying etiology   Signed, Christell Faith, PA-C Pager: [phone] 06/01/2015, 9:04 AM

## 2015-06-01 NOTE — Progress Notes (Signed)
[facility]  ROUNDING NOTE   Subjective:   Son at bedside.  Na 133  Objective:  Vital signs in last 24 hours:  Temp:  [96 F (35.6 C)-98.2 F (36.8 C)] 97.9 F (36.6 C) (04/20 0751) Pulse Rate:  [78-93] 78 (04/20 0802) Resp:  [18-20] 18 (04/20 0751) BP: (102-121)/(53-62) 109/58 mmHg (04/20 0751) SpO2:  [94 %-97 %] 94 % (04/20 0751) Weight:  [72.53 kg (159 lb 14.4 oz)] 72.53 kg (159 lb 14.4 oz) (04/20 0500)  Weight change: -1.788 kg (-3 lb 15.1 oz) Filed Weights   05/30/15 0500 05/31/15 0500 06/01/15 0500  Weight: 76.273 kg (168 lb 2.4 oz) 74.318 kg (163 lb 13.5 oz) 72.53 kg (159 lb 14.4 oz)    Intake/Output: I/O last 3 completed shifts: In: 240 [P.O.:240] Out: 1128 [Urine:1128]   Intake/Output this shift:  Total I/O In: 120 [P.O.:120] Out: 750 [Urine:750]  Physical Exam: General: NAD  Head: Normocephalic, atraumatic. Moist oral mucosal membranes  Eyes: Anicteric, PERRL  Neck: Supple, trachea midline  Lungs:  Clear to auscultation  Heart: Regular rate and rhythm  Abdomen:  Soft, nontender,   Extremities: + peripheral edema.  Neurologic: Nonfocal, moving all four extremities  Skin: No lesions       Basic Metabolic Panel:  Recent Labs Lab 05/28/15 0456 05/29/15 1207 05/30/15 0634 05/31/15 0545 06/01/15 0658  NA 125* 125* 127* 127* 133*  K 3.7 4.1 4.0 3.5 4.1  CL 97* 100* 101 96* 100*  CO2 22 20* 19* 24 27  GLUCOSE 80 129* 112* 151* 133*  BUN 15 17 20  25* 35*  CREATININE 0.87 0.91 0.90 0.87 1.04  CALCIUM 7.6* 7.6* 7.6* 7.7* 8.3*  MG  --   --   --   --  2.1    Liver Function Tests:  Recent Labs Lab 05/27/15 1246 05/28/15 0456 05/31/15 0545 06/01/15 0658  AST 32 30  --  98*  ALT 24 21  --  104*  ALKPHOS 52 40  --  51  BILITOT 1.1 0.8  --  1.2  PROT 6.6 5.5*  --  5.7*  ALBUMIN 3.7 3.0* 2.7* 2.8*   No results for input(s): LIPASE, AMYLASE in the last 168 hours. No results for input(s): AMMONIA in the last 168  hours.  CBC:  Recent Labs Lab 05/27/15 1246 05/28/15 0456 05/29/15 1207 05/30/15 0634 05/31/15 0545  WBC 4.4 4.0 8.5 7.9 8.5  NEUTROABS 3.4  --  6.4 5.8 7.4*  HGB 13.9 12.7* 12.3* 12.7* 12.7*  HCT 39.8* 35.7* 35.7* 36.8* 36.3*  MCV 85.5 86.3 85.7 87.3 85.4  PLT 69* 65* 80* 95* 123*    Cardiac Enzymes:  Recent Labs Lab 05/27/15 1246  TROPONINI <0.03    BNP: Invalid input(s): POCBNP  CBG: No results for input(s): GLUCAP in the last 168 hours.  Microbiology: Results for orders placed or performed during the hospital encounter of 05/27/15  CULTURE, BLOOD (ROUTINE X 2) w Reflex to PCR ID Panel     Status: None (Preliminary result)   Collection Time: 05/29/15 10:40 AM  Result Value Ref Range Status   Specimen Description BLOOD RIGHT AC  Final   Special Requests   Final    BOTTLES DRAWN AEROBIC AND ANAEROBIC AER 9ML ANA 6ML   Culture NO GROWTH 3 DAYS  Final   Report Status PENDING  Incomplete  CULTURE, BLOOD (ROUTINE X 2) w Reflex to PCR ID Panel     Status: None (Preliminary result)   Collection Time:  05/29/15 10:41 AM  Result Value Ref Range Status   Specimen Description BLOOD RIGHT HAN  Final   Special Requests   Final    BOTTLES DRAWN AEROBIC AND ANAEROBIC AER 9ML ANA 8ML   Culture NO GROWTH 3 DAYS  Final   Report Status PENDING  Incomplete    Coagulation Studies:  Recent Labs  05/29/15 1207  LABPROT 18.3*  INR 1.51    Urinalysis: No results for input(s): COLORURINE, LABSPEC, PHURINE, GLUCOSEU, HGBUR, BILIRUBINUR, KETONESUR, PROTEINUR, UROBILINOGEN, NITRITE, LEUKOCYTESUR in the last 72 hours.  Invalid input(s): APPERANCEUR    Imaging: No results found.   Medications:     . antiseptic oral rinse  7 mL Mouth Rinse BID  . docusate sodium  100 mg Oral BID  . esomeprazole  40 mg Oral Q1200  . fluticasone  2 spray Each Nare Daily  . furosemide  20 mg Oral BID  . levofloxacin  750 mg Oral Daily  . loratadine  10 mg Oral Daily  . metoprolol  tartrate  25 mg Oral BID  . potassium chloride  20 mEq Oral BID  . predniSONE  40 mg Oral Q breakfast  . rivaroxaban  20 mg Oral Q supper  . tamsulosin  0.4 mg Oral Daily   acetaminophen **OR** acetaminophen, benzonatate, bisacodyl, ipratropium-albuterol, ondansetron **OR** ondansetron (ZOFRAN) IV, traMADol, zolpidem  Assessment/ Plan:  [name] is a 82 y.o. white male with GERD, allergic rhinitis, hyeprtension, BPH, who was admitted to [facility] on 05/27/2015 for Hyponatremia [E87.1]   1. Hyponatremia: concerning with congestive heart failure. Holding hydrochlorothiazide.  - furosemide, switchied to PO - fluid restriction.   2. Hypertension with diastolic congestive heart failure: blood pressure at goal. Holding hydrochlorothiazide.  - amlodipine, tamsulosin and metorpolol.   3. Pneumonia:  - levofloxacin - Supportive care.   4. Thrombocytopenia: could be acute however low platelets in 2015. Improving.    LOS: Clark, Yunis Voorheis 4/20/201711:00 AM

## 2015-06-01 NOTE — Progress Notes (Signed)
Patient ID: Derek James, male   DOB: 1930-10-11, 82 y.o.   MRN: [id number] [place] [facility] [place] NAME: Derek James    MR#:  [id number]  DATE OF BIRTH:  November 22, 1930  SUBJECTIVE:   Proving shortness of breath. Dry cough. Poor appetite. Still on 2 L oxygen Atrial fibrillation better control.  REVIEW OF SYSTEMS:   Review of Systems  Constitutional: Positive for fever and malaise/fatigue. Negative for chills.  HENT: Negative for sore throat.   Eyes: Negative for blurred vision, double vision and pain.  Respiratory: Positive for cough and shortness of breath. Negative for hemoptysis and wheezing.   Cardiovascular: Negative for chest pain, palpitations, orthopnea and leg swelling.  Gastrointestinal: Negative for heartburn, nausea, vomiting, abdominal pain, diarrhea and constipation.  Genitourinary: Negative for dysuria and hematuria.  Musculoskeletal: Negative for back pain and joint pain.  Skin: Negative for rash.  Neurological: Positive for weakness. Negative for sensory change, speech change, focal weakness and headaches.  Endo/Heme/Allergies: Does not bruise/bleed easily.  Psychiatric/Behavioral: Negative for depression. The patient is not nervous/anxious.    DRUG ALLERGIES:   Allergies  Allergen Reactions  . Dexlansoprazole Diarrhea  . Escitalopram Diarrhea  . Lansoprazole Diarrhea    VITALS:  Blood pressure 109/58, pulse 78, temperature 97.9 F (36.6 C), temperature source Oral, resp. rate 18, height 5\' 6"  (1.676 m), weight 72.53 kg (159 lb 14.4 oz), SpO2 94 %.  PHYSICAL EXAMINATION:   Physical Exam  GENERAL:  82 y.o.-year-old patient lying in the bed,  Decreased hearing EYES: Pupils equal, round, reactive to light and accommodation. No scleral icterus. Extraocular muscles intact.  HEENT: Head atraumatic, normocephalic. Oropharynx and nasopharynx clear.  NECK:  Supple, no jugular venous distention. No thyroid  enlargement, no tenderness.  LUNGS:Bilateral crackles . Good air entry CARDIOVASCULAR: S1, S2 normal. No murmurs, rubs, or gallops.  ABDOMEN: Soft, nontender, nondistended. Bowel sounds present. No organomegaly or mass.  EXTREMITIES: No cyanosis, clubbing or edema b/l.    NEUROLOGIC: Cranial nerves II through XII are intact. No focal Motor or sensory deficits b/l.   PSYCHIATRIC:  patient is alert and oriented x 3.  SKIN: No obvious rash, lesion, or ulcer.   LABORATORY PANEL:  CBC  Recent Labs Lab 05/31/15 0545  WBC 8.5  HGB 12.7*  HCT 36.3*  PLT 123*    Chemistries   Recent Labs Lab 06/01/15 0658  NA 133*  K 4.1  CL 100*  CO2 27  GLUCOSE 133*  BUN 35*  CREATININE 1.04  CALCIUM 8.3*  MG 2.1  AST 98*  ALT 104*  ALKPHOS 51  BILITOT 1.2   Cardiac Enzymes  Recent Labs Lab 05/27/15 1246  TROPONINI <0.03   RADIOLOGY:  No results found. ASSESSMENT AND PLAN:  Derek James is a 82 y.o. male has a past medical history significant for HTN and GERD who presents to ER with 1 week hx of progressive weakness with "achiness", tremor, and fatigue.Presented to ER where he was noted to have a sodium 121. Is on HCTZ for HTN chronically  * Acute hypoxic respiratory failure  combination of pneumonia and CHF Wean oxygen as tolerated  * Acute on chronic diastolic CHF - Likely decompensated to do pneumonia - Improving - IV Lasix, Beta blockers - Input and Output - Monitor Bun/Cr and Potassium - Echo - EF 50% with Moderate MR. -Cardiology consulted and appreciate input.  * New onset atrial fib Likely due to pneumonia and moderate mitral  regurgitation On metoprolol.  Cardizem stopped due to 2.9 second positive yesterday reported by telemetry unit. xarelto.  * bilateral pneumonia levaquin. Check blood cultures. Nebulizers  Wean oxygen as tolerated.  * hypervolemic hyponatremia  - IV Lasix HCTZ stopped. Na improving  * Acute thrombocytopenia - Improving Likely  due to infection. Not DIC/TTP  * Hypertension -Continue metoprolol and amlodipine  * GERD  * Protein calorie malnutrition Consulted dietitian.  * Generalized weakness have physical therapy see patient Patient is very independent at home does not use any cane or walker.He continues to drive at baseline.  Case discussed with Care Management/Social Worker. Management plans discussed with the patient and  in agreement.  Discussed with patient and son at bedside in detail regarding congestive heart failure and atrial fibrillation. All questions answered.  CODE STATUS: Full  DVT Prophylaxis: SCDs  TOTAL TIME TAKING CARE OF THIS PATIENT: 40 minutes.  >50% time spent in discussing with patient and son at bedside  Note: This dictation was prepared with Dragon dictation along with smaller phrase technology. Any transcriptional errors that result from this process are unintentional.  Hillary Bow R M.D on 06/01/2015 at 10:51 AM  Between 7am to 6pm - Pager - [phone]  After 6pm go to [email or web address] - password [email or web address] [facility]  [facility]  Office  [phone]  CC: Primary care physician; Vianne Bulls. Arline Asp, MD

## 2015-06-01 NOTE — Evaluation (Addendum)
Physical Therapy Evaluation Patient Details Name: Derek James MRN: [id number] DOB: 1930-02-13 Today's Date: 06/01/2015   History of Present Illness  82 y.o. male has a past medical history significant for HTN and GERD who presents to ER with 1 week hx of progressive weakness with "achiness", tremor, and fatigue. No N/V. Minimal cough. Low grade fever at home. Pt was evaluated during admission by physical therapy and determined not to have any needs. New order received for re-evaluation. Pt reports that he has not been ambulated by nursing staff since he was seen by physical therapy last weekend.  Clinical Impression  Pt demonstrates excellent bed mobility and transfers. He reports some baseline balance deficits s/p mastoid surgery. Normally his balance is improved if he remains active but he states that no staff has ambulated with him since he was cleared by physical therapy over the weekend. Pt demonstrates some veering with gait requiring one correction by therapist for balance. Balance is easily corrected with rolling walker and encouraged pt to use at all times for safety at discharge. HR increases to around 115 bpm with ambulation. No DOE. SaO2 94% at rest on room air and SaO2 92% on room air with exertion. Quickly rebounds to 94% with standing rest break. RN notified. He is able to safely ascend/descend stairs in rehab gym with therapist. He would benefit from OP PT for balance training as he has never received physical therapy for his balance. He is unsure if he would like PT for his balance or not. Pt was provided with some simple home exercises that he can perform to work on his balance. Pt is safe to ambulate with staff with rolling walker. Pt will benefit from skilled PT services to address deficits in strength, balance, and mobility in order to return to full function at home.     Follow Up Recommendations Outpatient PT (OP PT for balance training)    Equipment Recommendations   Other (comment) (Must use rolling walker at discharge)    Recommendations for Other Services       Precautions / Restrictions Precautions Precautions: Fall Restrictions Weight Bearing Restrictions: No      Mobility  Bed Mobility Overal bed mobility: Independent             General bed mobility comments: Pt able to get to EOB with good speed and sequencing.  Transfers Overall transfer level: Independent Equipment used: Rolling walker (2 wheeled)             General transfer comment: Pt rises to standing and maintains balance without need for UEs. Stable in wide stance  Ambulation/Gait Ambulation/Gait assistance: Min guard Ambulation Distance (Feet): 250 Feet Assistive device: Rolling walker (2 wheeled) Gait Pattern/deviations: Staggering right;Staggering left Gait velocity: Functional for full household distances   General Gait Details: Pt ambulates with adequate gait speed for full household distances. He denies SOB throughout distance and HR increases to approximately 115 bpm during comfortable ambulation. Pt demonstrates some corrective stepping and lateral veering during ambulation without assistive device. He reports balance deficits at baseline due to mastoid surgery however if he remains active his balance is better. Utilized rolling walker for additional ambulation distance and pt demonstrates improved balance.  Stairs Stairs: Yes Stairs assistance: Modified independent (Device/Increase time) Stair Management: Two rails;Step to pattern Number of Stairs: 4 General stair comments: Pt negotiates stairs safely and comfortably. No signs for imbalance or safety issues  Wheelchair Mobility    Modified Rankin (Stroke Patients Only)  Balance Overall balance assessment: Needs assistance Sitting-balance support: No upper extremity supported Sitting balance-Leahy Scale: Normal     Standing balance support: No upper extremity supported Standing  balance-Leahy Scale: Fair Standing balance comment: Pt able to maintain wide stance without UE support. He is able to achieve feet together position slowly but without falling. Unable to maintain position for 10 seconds due to LOB. Single leg balance is <2 seconds bilaterally                             Pertinent Vitals/Pain Pain Assessment: No/denies pain    Home Living Family/patient expects to be discharged to:: Private residence Living Arrangements: Alone Available Help at Discharge: Family Type of Home: House Home Access: Stairs to enter Entrance Stairs-Rails: Can reach both Entrance Stairs-Number of Steps: 2 Home Layout: One level Home Equipment: [place] - 2 wheels;Walker - standard;Cane - single point;Other (comment) (shower chair broken)      Prior Function Level of Independence: Independent         Comments: Pt able to drive, run all her errands, etc - he states he goes to the gym 4-6x/week     Hand Dominance   Dominant Hand: Right    Extremity/Trunk Assessment   Upper Extremity Assessment: Overall WFL for tasks assessed           Lower Extremity Assessment: Overall WFL for tasks assessed         Communication   Communication: No difficulties  Cognition Arousal/Alertness: Awake/alert Behavior During Therapy: WFL for tasks assessed/performed Overall Cognitive Status: Within Functional Limits for tasks assessed                      General Comments      Exercises        Assessment/Plan    PT Assessment Patient needs continued PT services  PT Diagnosis Generalized weakness   PT Problem List Decreased balance;Decreased strength;Decreased mobility  PT Treatment Interventions DME instruction;Gait training;Therapeutic activities;Therapeutic exercise;Balance training;Neuromuscular re-education   PT Goals (Current goals can be found in the Care Plan section) Acute Rehab PT Goals Patient Stated Goal: go home PT Goal Formulation:  With patient Time For Goal Achievement: 06/15/15 Potential to Achieve Goals: Good    Frequency Min 2X/week   Barriers to discharge Decreased caregiver support Lives alone    Co-evaluation               End of Session Equipment Utilized During Treatment: Gait belt Activity Tolerance: Patient tolerated treatment well Patient left: with call bell/phone within reach;in chair;with chair alarm set Nurse Communication: Other (comment) (Discussed DC recommendations with CSW)         Time: 1335-1403 PT Time Calculation (min) (ACUTE ONLY): 28 min   Charges:   PT Evaluation $PT Re-evaluation: 1 Procedure PT Treatments $Gait Training: 8-22 mins   PT G Codes:       Lyndel Safe Huprich PT, DPT   Huprich,Jason 06/01/2015, 2:41 PM

## 2015-06-01 NOTE — Progress Notes (Signed)
TC to [facility], order to hold diltiazem, BP 109/58, give metoprolol

## 2015-06-02 ENCOUNTER — Encounter: Payer: Self-pay | Admitting: Nurse Practitioner

## 2015-06-02 LAB — CBC WITH DIFFERENTIAL/PLATELET
BASOS ABS: 0 10*3/uL (ref 0–0.1)
BASOS PCT: 0 %
EOS PCT: 0 %
Eosinophils Absolute: 0 10*3/uL (ref 0–0.7)
HCT: 39.5 % — ABNORMAL LOW (ref 40.0–52.0)
Hemoglobin: 13.6 g/dL (ref 13.0–18.0)
LYMPHS PCT: 9 %
Lymphs Abs: 0.8 10*3/uL — ABNORMAL LOW (ref 1.0–3.6)
MCH: 30.1 pg (ref 26.0–34.0)
MCHC: 34.5 g/dL (ref 32.0–36.0)
MCV: 87.2 fL (ref 80.0–100.0)
Monocytes Absolute: 1 10*3/uL (ref 0.2–1.0)
Monocytes Relative: 11 %
Neutro Abs: 7.3 10*3/uL — ABNORMAL HIGH (ref 1.4–6.5)
Neutrophils Relative %: 80 %
PLATELETS: 189 10*3/uL (ref 150–440)
RBC: 4.53 MIL/uL (ref 4.40–5.90)
RDW: 13.9 % (ref 11.5–14.5)
WBC: 9.1 10*3/uL (ref 3.8–10.6)

## 2015-06-02 LAB — BASIC METABOLIC PANEL WITH GFR
Anion gap: 8 (ref 5–15)
BUN: 39 mg/dL — ABNORMAL HIGH (ref 6–20)
CO2: 26 mmol/L (ref 22–32)
Calcium: 8.3 mg/dL — ABNORMAL LOW (ref 8.9–10.3)
Chloride: 102 mmol/L (ref 101–111)
Creatinine, Ser: 1.15 mg/dL (ref 0.61–1.24)
GFR calc Af Amer: >60 mL/min
GFR calc non Af Amer: 57 mL/min — ABNORMAL LOW
Glucose, Bld: 135 mg/dL — ABNORMAL HIGH (ref 65–99)
Potassium: 3.9 mmol/L (ref 3.5–5.1)
Sodium: 136 mmol/L (ref 135–145)

## 2015-06-02 MED ORDER — BENZONATATE 200 MG PO CAPS: 200.0000 mg | ORAL_CAPSULE | Freq: Three times a day (TID) | ORAL | Status: DC | PRN

## 2015-06-02 MED ORDER — POTASSIUM CHLORIDE CRYS ER 20 MEQ PO TBCR: 20.0000 meq | EXTENDED_RELEASE_TABLET | Freq: Every day | ORAL | Status: DC

## 2015-06-02 MED ORDER — METOPROLOL TARTRATE 25 MG PO TABS: 25.0000 mg | ORAL_TABLET | Freq: Two times a day (BID) | ORAL | Status: DC

## 2015-06-02 MED ORDER — RIVAROXABAN 20 MG PO TABS: 20.0000 mg | ORAL_TABLET | Freq: Every day | ORAL | Status: AC

## 2015-06-02 MED ORDER — FUROSEMIDE 20 MG PO TABS: 40.0000 mg | ORAL_TABLET | Freq: Every day | ORAL | Status: DC

## 2015-06-02 MED ORDER — LEVOFLOXACIN 750 MG PO TABS: 750.0000 mg | ORAL_TABLET | Freq: Every day | ORAL | Status: DC

## 2015-06-02 NOTE — Care Management Important Message (Signed)
Important Message  Patient Details  Name: Derek James MRN: [id number] Date of Birth: June 04, 1930   Medicare Important Message Given:  Yes    [name] 06/02/2015, 11:59 AM

## 2015-06-02 NOTE — Discharge Summary (Signed)
[facility] NAME: Derek James    MR#:  [id number]  DATE OF BIRTH:  23-Mar-1930  DATE OF ADMISSION:  05/27/2015 ADMITTING PHYSICIAN: Idelle Crouch, MD  DATE OF DISCHARGE: No discharge date for patient encounter.  PRIMARY CARE PHYSICIAN: Vianne Bulls. Arline Asp, MD   ADMISSION DIAGNOSIS:  Hyponatremia [E87.1]  DISCHARGE DIAGNOSIS:  Principal Problem:   Hyponatremia Active Problems:   Weakness generalized   HTN, goal below 140/80   Tremor   New onset atrial fibrillation (HCC)   Pneumonia due to other specified infectious organisms   Acute on chronic diastolic (congestive) heart failure (HCC)   Acute respiratory distress ([place])   SECONDARY DIAGNOSIS:   Past Medical History  Diagnosis Date  . Hypertension   . Rhinitis   . GERD (gastroesophageal reflux disease)   . A-fib ([place])     a. new onset 05/2015; b. CHADS2VASC => 4 (CHF, HTN, age x 2)-->Xarelto initiated-->converted to sinus w/ pac's  . Chronic diastolic CHF (congestive heart failure) ([place])     a. echo 05/2015: Ef 50-55%, rhythm Afib, moderately dilated aortic root, mild AI, moderate MR, LA normal in size, PASP 44 mmHg     ADMITTING HISTORY  HPI: Derek James is a 82 y.o. male has a past medical history significant for HTN and GERD who presents to ER with 1 week hx of progressive weakness with "achiness", tremor, and fatigue. No N/V. Minimal cough. Low grade fever at home. No N/V/D. Presented to ER where he was noted to have a sodium=121. Is on HCTZ for HTN chronically. He is now admitted. Denies CP or SOB. No seizures.  HOSPITAL COURSE:   Derek James is a 82 y.o. male has a past medical history significant for HTN and GERD who presents to ER with 1 week hx of progressive weakness with "achiness", tremor, and fatigue.Presented to ER where he was noted to have a sodium 121. Is on HCTZ for HTN chronically  * Acute hypoxic respiratory  failure combination of pneumonia and CHF Result. Patient's saturations 95% on room air on ambulation.  * Acute on chronic diastolic CHF - Likely decompensated to do pneumonia - IV Lasix, Beta blockers - Echo - EF 50% with Moderate MR. Resolved. Discharge home on metoprolol, Lasix and potassium supplements.  * New onset atrial fib Likely due to pneumonia and moderate mitral regurgitation On metoprolol.  Cardizem stopped due to 2.9 second positive yesterday reported by telemetry unit. Xarelto. Patient is back to normal sinus rhythm on day of discharge. Follow-up with cardiology in 1-2 weeks.  * bilateral pneumonia levaquin. Check blood cultures. Nebulizers  Wean oxygen as tolerated.  * hypervolemic hyponatremia - IV Lasix in the hospital. Switch to oral Lasix at discharge. HCTZ stopped. Na normal  * Acute thrombocytopenia - resolved Likely due to infection. Not DIC/TTP  * Hypertension -Continue metoprolol  * GERD  * Protein calorie malnutrition Consulted dietitian.  * Generalized weakness Outpatient physical therapy has been set up.  Stable for discharge home to follow-up with primary care physician and cardiology.  CONSULTS OBTAINED:  Treatment Team:  Lavonia Dana, MD Wellington Hampshire, MD  DRUG ALLERGIES:   Allergies  Allergen Reactions  . Dexlansoprazole Diarrhea  . Escitalopram Diarrhea  . Lansoprazole Diarrhea    DISCHARGE MEDICATIONS:   Current Discharge Medication List    START taking these medications   Details  benzonatate (TESSALON) 200 MG capsule Take 1 capsule (200 mg total)  by mouth 3 (three) times daily as needed for cough. Qty: 20 capsule, Refills: 0    furosemide (LASIX) 20 MG tablet Take 2 tablets (40 mg total) by mouth daily. Qty: 30 tablet, Refills: 0    levofloxacin (LEVAQUIN) 750 MG tablet Take 1 tablet (750 mg total) by mouth daily. Qty: 4 tablet, Refills: 0    metoprolol tartrate (LOPRESSOR) 25 MG tablet Take 1 tablet (25  mg total) by mouth 2 (two) times daily. Qty: 60 tablet, Refills: 0    potassium chloride SA (K-DUR,KLOR-CON) 20 MEQ tablet Take 1 tablet (20 mEq total) by mouth daily. Qty: 30 tablet, Refills: 0    rivaroxaban (XARELTO) 20 MG TABS tablet Take 1 tablet (20 mg total) by mouth daily with supper. Qty: 30 tablet, Refills: 0      CONTINUE these medications which have NOT CHANGED   Details  esomeprazole (NEXIUM) 20 MG capsule Take 20 mg by mouth daily at 12 noon.    fluticasone (FLONASE) 50 MCG/ACT nasal spray Place 2 sprays into both nostrils daily. Refills: 5    loratadine (CLARITIN) 10 MG tablet Take 10 mg by mouth daily.    tamsulosin (FLOMAX) 0.4 MG CAPS capsule Take 0.4 mg by mouth daily.     zolpidem (AMBIEN) 10 MG tablet Take 5 mg by mouth at bedtime as needed for sleep.  Refills: 0    Probiotic Product (ALIGN) 4 MG CAPS Take 4 mg by mouth daily.      STOP taking these medications     aspirin EC 81 MG tablet      hydrochlorothiazide (HYDRODIURIL) 25 MG tablet         Today   VITAL SIGNS:  Blood pressure 115/60, pulse 68, temperature 98 F (36.7 C), temperature source Oral, resp. rate 18, height 5\' 6"  (1.676 m), weight 72.53 kg (159 lb 14.4 oz), SpO2 97 %.  I/O:   Intake/Output Summary (Last 24 hours) at 06/02/15 1433 Last data filed at 06/02/15 0951  Gross per 24 hour  Intake    720 ml  Output    850 ml  Net   -130 ml    PHYSICAL EXAMINATION:  Physical Exam  GENERAL:  82 y.o.-year-old patient lying in the bed with no acute distress.  LUNGS: Normal breath sounds bilaterally, no wheezing, rales,rhonchi or crepitation. No use of accessory muscles of respiration.  CARDIOVASCULAR: S1, S2 normal. No murmurs, rubs, or gallops.  ABDOMEN: Soft, non-tender, non-distended. Bowel sounds present. No organomegaly or mass.  NEUROLOGIC: Moves all 4 extremities. PSYCHIATRIC: The patient is alert and oriented x 3.  SKIN: No obvious rash, lesion, or ulcer.   DATA REVIEW:    CBC  Recent Labs Lab 06/02/15 0338  WBC 9.1  HGB 13.6  HCT 39.5*  PLT 189    Chemistries   Recent Labs Lab 06/01/15 0658 06/02/15 0338  NA 133* 136  K 4.1 3.9  CL 100* 102  CO2 27 26  GLUCOSE 133* 135*  BUN 35* 39*  CREATININE 1.04 1.15  CALCIUM 8.3* 8.3*  MG 2.1  --   AST 98*  --   ALT 104*  --   ALKPHOS 51  --   BILITOT 1.2  --     Cardiac Enzymes  Recent Labs Lab 05/27/15 1246  TROPONINI <0.03    Microbiology Results  Results for orders placed or performed during the hospital encounter of 05/27/15  CULTURE, BLOOD (ROUTINE X 2) w Reflex to PCR ID Panel  Status: None (Preliminary result)   Collection Time: 05/29/15 10:40 AM  Result Value Ref Range Status   Specimen Description BLOOD RIGHT AC  Final   Special Requests   Final    BOTTLES DRAWN AEROBIC AND ANAEROBIC AER 9ML ANA 6ML   Culture NO GROWTH 4 DAYS  Final   Report Status PENDING  Incomplete  CULTURE, BLOOD (ROUTINE X 2) w Reflex to PCR ID Panel     Status: None (Preliminary result)   Collection Time: 05/29/15 10:41 AM  Result Value Ref Range Status   Specimen Description BLOOD RIGHT HAN  Final   Special Requests   Final    BOTTLES DRAWN AEROBIC AND ANAEROBIC AER 9ML ANA 8ML   Culture NO GROWTH 4 DAYS  Final   Report Status PENDING  Incomplete    RADIOLOGY:  Dg Chest 2 View  06/01/2015  CLINICAL DATA:  Diagnosed with pneumonia. Cough. Subsequent encounter. Also a mention of congestive heart failure. EXAM: CHEST  2 VIEW COMPARISON:  05/30/2015 FINDINGS: Interstitial opacities in the mild airspace opacities seen previously have resolved. There is some residual mild scarring in the anterior lung bases and apices. There is some linear opacity in the lung bases associated with small effusions, most likely atelectasis. Cardiac silhouette is borderline enlarged. No mediastinal or hilar masses or evidence of adenopathy. IMPRESSION: 1. Significant improvement in lung aeration since the most recent  prior exam. This is most consistent with resolved congestive heart failure. No convincing pneumonia. Electronically Signed   By: Lajean Manes M.D.   On: 06/01/2015 11:50    Follow up with PCP in 1 week.  Management plans discussed with the patient, family and they are in agreement.  CODE STATUS:     Code Status Orders        Start     Ordered   05/27/15 1535  Full code   Continuous     05/27/15 1534    Code Status History    Date Active Date Inactive Code Status Order ID Comments User Context   This patient has a current code status but no historical code status.    Advance Directive Documentation        Most Recent Value   Type of Advance Directive  Healthcare Power of Attorney   Pre-existing out of facility DNR order (yellow form or pink MOST form)     "MOST" Form in Place?        TOTAL TIME TAKING CARE OF THIS PATIENT ON DAY OF DISCHARGE: more than 30 minutes.   Hillary Bow R M.D on 06/02/2015 at 2:33 PM  Between 7am to 6pm - Pager - [phone]  After 6pm go to [email or web address] - password [email or web address] [facility]  [facility]  Office  [phone]  CC: Primary care physician; Vianne Bulls. Arline Asp, MD  Note: This dictation was prepared with Dragon dictation along with smaller phrase technology. Any transcriptional errors that result from this process are unintentional.

## 2015-06-02 NOTE — Discharge Instructions (Signed)

## 2015-06-02 NOTE — Care Management (Addendum)
PT is recommending OPPT and not requiring O2. He states "he is ready to go home".   He uses [facility] [phone] for Rx. He is now on Xarelto which is new. I have paged Dr. Darvin Neighbours to request Rx for price check. Telephone order received at 0830 today from Dr Darvin Neighbours for Xarelto 20mg  PO daily for 30 days and follow up with cardiologist. I have called this in to his pharmacy to check price.  Xarelto $45. Patient needs to follow up with cardiologist.

## 2015-06-02 NOTE — Progress Notes (Signed)
Patient is discharge home in stable condition , summary and f/u care given to both pt's and grandson verbalized understanding,

## 2015-06-02 NOTE — Progress Notes (Signed)
Patient Name: Derek James Date of Encounter: 06/02/2015  Hospital Problem List     Principal Problem:   Hyponatremia Active Problems:   Pneumonia due to other specified infectious organisms   Acute on chronic diastolic (congestive) heart failure (HCC)   Acute respiratory distress (HCC)   New onset atrial fibrillation (HCC)   Weakness generalized   HTN, goal below 140/80   Tremor    Subjective   Feels well.  No dyspnea or palpitations.  Converted to sinus yesterday afternoon.  Eager to go home.  Inpatient Medications    . antiseptic oral rinse  7 mL Mouth Rinse BID  . docusate sodium  100 mg Oral BID  . esomeprazole  40 mg Oral Q1200  . fluticasone  2 spray Each Nare Daily  . furosemide  20 mg Oral BID  . levofloxacin  750 mg Oral Daily  . loratadine  10 mg Oral Daily  . metoprolol tartrate  25 mg Oral BID  . potassium chloride  20 mEq Oral BID  . predniSONE  40 mg Oral Q breakfast  . rivaroxaban  20 mg Oral Q supper  . tamsulosin  0.4 mg Oral Daily    Vital Signs    Filed Vitals:   06/01/15 2009 06/01/15 2130 06/02/15 0558 06/02/15 0819  BP: 126/48 123/64 114/60 116/77  Pulse: 76 60 125 64  Temp: 97.8 F (36.6 C)  98 F (36.7 C) 98.2 F (36.8 C)  TempSrc: Oral  Oral Oral  Resp: 18  18 18   Height:      Weight:      SpO2: 95%  93% 96%    Intake/Output Summary (Last 24 hours) at 06/02/15 1014 Last data filed at 06/02/15 0951  Gross per 24 hour  Intake    930 ml  Output   1400 ml  Net   -470 ml   Filed Weights   05/30/15 0500 05/31/15 0500 06/01/15 0500  Weight: 168 lb 2.4 oz (76.273 kg) 163 lb 13.5 oz (74.318 kg) 159 lb 14.4 oz (72.53 kg)    Physical Exam    General: Pleasant, NAD. Neuro: Alert and oriented X 3. Moves all extremities spontaneously. Psych: Normal affect. HEENT:  Normal  Neck: Supple without bruits or JVD. Lungs:  Resp regular and unlabored, CTA. Heart: Irreg, no s3, s4, or murmurs. Abdomen: Soft, non-tender,  non-distended, BS + x 4.  Extremities: No clubbing, cyanosis or edema. DP/PT/Radials 2+ and equal bilaterally.  Labs    CBC  Recent Labs  05/31/15 0545 06/02/15 0338  WBC 8.5 9.1  NEUTROABS 7.4* 7.3*  HGB 12.7* 13.6  HCT 36.3* 39.5*  MCV 85.4 87.2  PLT 123* [id number]   Basic Metabolic Panel  Recent Labs  06/01/15 0658 06/02/15 0338  NA 133* 136  K 4.1 3.9  CL 100* 102  CO2 27 26  GLUCOSE 133* 135*  BUN 35* 39*  CREATININE 1.04 1.15  CALCIUM 8.3* 8.3*  MG 2.1  --    Liver Function Tests  Recent Labs  05/31/15 0545 06/01/15 0658  AST  --  98*  ALT  --  104*  ALKPHOS  --  51  BILITOT  --  1.2  PROT  --  5.7*  ALBUMIN 2.7* 2.8*    Telemetry    Converted to sinus with freq pac's, pvc's/couplets, and brief runs of PAT - since 4/20 @ 14:19.  Radiology    Dg Chest 2 View  06/01/2015  CLINICAL DATA:  Diagnosed with  pneumonia. Cough. Subsequent encounter. Also a mention of congestive heart failure. EXAM: CHEST  2 VIEW COMPARISON:  05/30/2015 FINDINGS: Interstitial opacities in the mild airspace opacities seen previously have resolved. There is some residual mild scarring in the anterior lung bases and apices. There is some linear opacity in the lung bases associated with small effusions, most likely atelectasis. Cardiac silhouette is borderline enlarged. No mediastinal or hilar masses or evidence of adenopathy. IMPRESSION: 1. Significant improvement in lung aeration since the most recent prior exam. This is most consistent with resolved congestive heart failure. No convincing pneumonia. Electronically Signed   By: Lajean Manes M.D.   On: 06/01/2015 11:50   Dg Chest 2 View  05/30/2015  CLINICAL DATA:  Evaluate hypoxia EXAM: CHEST  2 VIEW COMPARISON:  Two days ago FINDINGS: Progressive interstitial opacity with asymmetric left apical airspace disease. Small bilateral pleural effusion. No pneumothorax. Stable borderline cardiomegaly with negative aortic contours. IMPRESSION:  1. New left upper lobe pneumonia or atelectasis. 2. Pulmonary venous congestion and small pleural effusions. Electronically Signed   By: Monte Fantasia M.D.   On: 05/30/2015 09:55   Dg Chest 2 View  05/28/2015  CLINICAL DATA:  Worsening cough. EXAM: CHEST  2 VIEW COMPARISON:  Portable AP view yesterday. FINDINGS: The heart is prominent in size, unchanged from prior, likely accentuated by AP technique. There are new bibasilar opacities, localizing to the lower lobes on the lateral view. No evidence of pleural effusion or pneumothorax. The bones are under mineralized. IMPRESSION: New bibasilar opacities, may be atelectasis or aspiration given distribution. Pneumonia or pulmonary edema could have a similar appearance, however felt less likely. Electronically Signed   By: Jeb Levering M.D.   On: 05/28/2015 23:47   Dg Chest Portable 1 View  05/27/2015  CLINICAL DATA:  82 year old male with tightness in the neck and back and hyponatremia EXAM: PORTABLE CHEST 1 VIEW COMPARISON:  Prior chest x-ray 11/20/2013 FINDINGS: Portable frontal chest x-ray likely exaggerates the size of the cardiopericardial silhouette. No significant interval change compared to prior. Atherosclerotic calcifications again present in the transverse aorta. The lungs are clear. No pneumothorax or pleural effusion. Stable mild bronchitic change. No acute osseous abnormality. IMPRESSION: No active disease. Electronically Signed   By: Jacqulynn Cadet M.D.   On: 05/27/2015 13:12    Assessment & Plan    1. Acute respiratory distress with hypoxia requiring nasal cannula: -Likely multifactorial including his left upper lobe PNA, acute on chronic diastolic CHF, third spacing from hypoalbuminemia, and new onset Afib -Off O2.  2. New onset Afib: -In the setting of his left upper lobe PNA and volume overload/third spacing   -Converted to sinus on the afternoon of 4/20 @ 14:19.  Now with frequent PAC's and short runs of PAT. -Rates trending  mostly in the 80's. -Remains on Lopressor 25 mg bid.  Dilt d/c'd 4/20 secondary to concern re: 2.9 second pause. -CHADS2VASc at least 4 (CHF, HTN, age x 2) -Continue Xarelto 20 mg q dinner -Now that he has converted, on role for DCCV @ this time. -F/u in clinic with [place] in 1-2 wks.  3. Acute on chronic diastolic CHF: -Was on NaCl infusion from admission until 4/17 -BNP elevated at 706, right-sided pressures elevated at 44 mm Hg, cannot exclude some component of systolic dysfunction in the setting of the patient's new onset Afib. -Net negative 1.2L since admission (minus ~ 3.6L since 4/18). -Wt down to 159 lbs on 4/20 - down from a peak of 168  on 4/18.  No weight yet this AM. -Euvolemic on exam. -HR/BP stable. -Cont PO lasix @ current dose. -OK for d/c from our standpoint.  F/u with [place] in 1-2 wks.  4. Left upper lobe PNA with sepsis: -On ABX per IM -Nebs and steroids per IM  5. Hypoalbuminemia: -Has had decreased appetite for past several weeks given stressors at home, likely leading to his decreased albumin and hyponatremia   -Presented with an albumin of 3.0 and a total protein of 5.5 -Likely contributing to his above acute respiratory distress and pulmonary vascular congestion via third spacing   -Per IM/nutrition   -Treat underlying etiology  6.  Hyponatremia: -Na 136 this AM. -Nephrology following.  7.  Essential HTN: -Stable on  blocker.  Signed, Murray Hodgkins NP

## 2015-06-03 LAB — CULTURE, BLOOD (ROUTINE X 2)
Culture: NO GROWTH
Culture: NO GROWTH

## 2015-06-05 ENCOUNTER — Encounter: Payer: Self-pay | Admitting: Internal Medicine

## 2015-06-15 ENCOUNTER — Ambulatory Visit (INDEPENDENT_AMBULATORY_CARE_PROVIDER_SITE_OTHER): Payer: Medicare Other | Admitting: Cardiology

## 2015-06-15 ENCOUNTER — Encounter: Payer: Self-pay | Admitting: Cardiology

## 2015-06-15 VITALS — BP 130/58 | HR 111 | Ht 64.0 in | Wt 138.5 lb

## 2015-06-15 DIAGNOSIS — I5032 Chronic diastolic (congestive) heart failure: Secondary | ICD-10-CM | POA: Diagnosis not present

## 2015-06-15 DIAGNOSIS — R079 Chest pain, unspecified: Secondary | ICD-10-CM | POA: Diagnosis not present

## 2015-06-15 DIAGNOSIS — R0602 Shortness of breath: Secondary | ICD-10-CM | POA: Diagnosis not present

## 2015-06-15 DIAGNOSIS — I4891 Unspecified atrial fibrillation: Secondary | ICD-10-CM

## 2015-06-15 DIAGNOSIS — I77810 Thoracic aortic ectasia: Secondary | ICD-10-CM

## 2015-06-15 MED ORDER — METOPROLOL TARTRATE 25 MG PO TABS: 25.0000 mg | ORAL_TABLET | ORAL | Status: DC

## 2015-06-15 MED ORDER — POTASSIUM CHLORIDE ER 10 MEQ PO TBCR: 20.0000 meq | EXTENDED_RELEASE_TABLET | Freq: Every day | ORAL | Status: DC

## 2015-06-15 NOTE — Patient Instructions (Addendum)
Medication Instructions:  Your physician has recommended you make the following change in your medication:  1. STOP taking your HCTZ 2. Take Metoprolol 50 mg (2 tablets) in the AM and 25mg  (1 tablet) in the PM 3. Sent in prescription for the KCL 10 meq take 2 tablets once daily.     Labwork: TSH lab done today.  Testing/Procedures: [place]  Your caregiver has ordered a Stress Test with nuclear imaging. The purpose of this test is to evaluate the blood supply to your heart muscle. This procedure is referred to as a "Non-Invasive Stress Test." This is because other than having an IV started in your vein, nothing is inserted or "invades" your body. Cardiac stress tests are done to find areas of poor blood flow to the heart by determining the extent of coronary artery disease (CAD). Some patients exercise on a treadmill, which naturally increases the blood flow to your heart, while others who are  unable to walk on a treadmill due to physical limitations have a pharmacologic/chemical stress agent called Lexiscan . This medicine will mimic walking on a treadmill by temporarily increasing your coronary blood flow.   Please note: these test may take anywhere between 2-4 hours to complete  PLEASE REPORT TO [place] AT THE FIRST DESK WILL DIRECT YOU WHERE TO GO  Date of Procedure:__Tuesday Jun 20, 2015 at 07:30AM_____  Arrival Time for Procedure:____Arrive at 07:15AM________  Instructions regarding medication:   _X__:  Hold Metoprolol the night before procedure and morning of procedure  __X__:  Hold Lasix until after your procedure has been done.   PLEASE NOTIFY THE OFFICE AT LEAST 84 HOURS IN ADVANCE IF YOU ARE UNABLE TO KEEP YOUR APPOINTMENT.  [phone] AND  PLEASE NOTIFY NUCLEAR MEDICINE AT [facility] AT LEAST 24 HOURS IN ADVANCE IF YOU ARE UNABLE TO KEEP YOUR APPOINTMENT. [phone]  How to prepare for your Myoview test:   Do not eat or drink  after midnight  No caffeine for 24 hours prior to test  No smoking 24 hours prior to test.  Your medication may be taken with water.  If your doctor stopped a medication because of this test, do not take that medication.  Ladies, please do not wear dresses.  Skirts or pants are appropriate. Please wear a short sleeve shirt.  No perfume, cologne or lotion.  Wear comfortable walking shoes. No heels!    Follow-Up: Your physician recommends that you schedule a follow-up appointment after testing to see Dr. Yvone Neu.  Date & Time: __________________________________________________________   Any Other Special Instructions Will Be Listed Below (If Applicable).     If you need a refill on your cardiac medications before your next appointment, please call your pharmacy.  Pharmacologic Stress Electrocardiogram A pharmacologic stress electrocardiogram is a heart (cardiac) test that uses nuclear imaging to evaluate the blood supply to your heart. This test may also be called a pharmacologic stress electrocardiography. Pharmacologic means that a medicine is used to increase your heart rate and blood pressure.  This stress test is done to find areas of poor blood flow to the heart by determining the extent of coronary artery disease (CAD). Some people exercise on a treadmill, which naturally increases the blood flow to the heart. For those people unable to exercise on a treadmill, a medicine is used. This medicine stimulates your heart and will cause your heart to beat harder and more quickly, as if you were exercising.  Pharmacologic stress tests can  help determine:  The adequacy of blood flow to your heart during increased levels of activity in order to clear you for discharge home.  The extent of coronary artery blockage caused by CAD.  Your prognosis if you have suffered a heart attack.  The effectiveness of cardiac procedures done, such as an angioplasty, which can increase the  circulation in your coronary arteries.  Causes of chest pain or pressure. LET [facility] CARE PROVIDER KNOW ABOUT:  Any allergies you have.  All medicines you are taking, including vitamins, herbs, eye drops, creams, and over-the-counter medicines.  Previous problems you or members of your family have had with the use of anesthetics.  Any blood disorders you have.  Previous surgeries you have had.  Medical conditions you have.  Possibility of pregnancy, if this applies.  If you are currently breastfeeding. RISKS AND COMPLICATIONS Generally, this is a safe procedure. However, as with any procedure, complications can occur. Possible complications include:  You develop pain or pressure in the following areas:  Chest.  Jaw or neck.  Between your shoulder blades.  Radiating down your left arm.  Headache.  Dizziness or light-headedness.  Shortness of breath.  Increased or irregular heartbeat.  Low blood pressure.  Nausea or vomiting.  Flushing.  Redness going up the arm and slight pain during injection of medicine.  Heart attack (rare). BEFORE THE PROCEDURE   Avoid all forms of caffeine for 24 hours before your test or as directed by your health care provider. This includes coffee, tea (even decaffeinated tea), caffeinated sodas, chocolate, cocoa, and certain pain medicines.  Follow your health care provider's instructions regarding eating and drinking before the test.  Take your medicines as directed at regular times with water unless instructed otherwise. Exceptions may include:  If you have diabetes, ask how you are to take your insulin or pills. It is common to adjust insulin dosing the morning of the test.  If you are taking beta-blocker medicines, it is important to talk to your health care provider about these medicines well before the date of your test. Taking beta-blocker medicines may interfere with the test. In some cases, these medicines need to be  changed or stopped 24 hours or more before the test.  If you wear a nitroglycerin patch, it may need to be removed prior to the test. Ask your health care provider if the patch should be removed before the test.  If you use an inhaler for any breathing condition, bring it with you to the test.  If you are an outpatient, bring a snack so you can eat right after the stress phase of the test.  Do not smoke for 4 hours prior to the test or as directed by your health care provider.  Do not apply lotions, powders, creams, or oils on your chest prior to the test.  Wear comfortable shoes and clothing. Let your health care provider know if you were unable to complete or follow the preparations for your test. PROCEDURE   Multiple patches (electrodes) will be put on your chest. If needed, small areas of your chest may be shaved to get better contact with the electrodes. Once the electrodes are attached to your body, multiple wires will be attached to the electrodes, and your heart rate will be monitored.  An IV access will be started. A nuclear trace (isotope) is given. The isotope may be given intravenously, or it may be swallowed. Nuclear refers to several types of radioactive isotopes, and the  nuclear isotope lights up the arteries so that the nuclear images are clear. The isotope is absorbed by your body. This results in low radiation exposure.  A resting nuclear image is taken to show how your heart functions at rest.  A medicine is given through the IV access.  A second scan is done about 1 hour after the medicine injection and determines how your heart functions under stress.  During this stress phase, you will be connected to an electrocardiogram machine. Your blood pressure and oxygen levels will be monitored. AFTER THE PROCEDURE   Your heart rate and blood pressure will be monitored after the test.  You may return to your normal schedule, including diet,activities, and medicines,  unless your health care provider tells you otherwise.   This information is not intended to replace advice given to you by your health care provider. Make sure you discuss any questions you have with your health care provider.   Document Released: 06/16/2008 Document Revised: 02/02/2013 Document Reviewed: 10/05/2012 Elsevier Interactive Patient Education [employer].

## 2015-06-15 NOTE — Progress Notes (Signed)
Cardiology Office Note   Date:  06/16/2015   ID:  Derek James, DOB [birth year], MRN [id number]  Referring Doctor:  [place] Pink, MD   Cardiologist:   Wende Bushy, MD   Reason for consultation:  Chief Complaint  Patient presents with  . other    hosp ffup for afib, CHF. Meds reviewed verbally.      History of Present Illness: Derek James is a 82 y.o. male who presents for follow up after hospital stay.  Pt was seen on his last admission April 15 to  06/02/2015. He was found to have new onset paroxysmal atrial fibrillation, diastolic congestive heart failure, in the setting of pneumonia and other medical issues including hyponatremia.    was discharged on several new medications including furosemide, metoprolol, potassium, Xarelto. He complains that the potassium pill is rather large and difficult for him to swallow.  Patient reports occasional chest pain, mild in intensity, randomly occurring, nonradiating, and described as a dullness in the chest, lasting minutes at a time, so intensely resolving. Next  Patient reports shortness of breath is improved since discharge. He denies orthopnea, PND, edema.  Other complaints include poor appetite, and overall not feeling well. Patient denies headache, colds, fever he has nonproductive cough.   ROS:  Please see the history of present illness. Aside from mentioned under HPI, all other systems are reviewed and negative.     Past Medical History  Diagnosis Date  . Hypertension   . Rhinitis   . GERD (gastroesophageal reflux disease)   . A-fib ([place])     a. new onset 05/2015; b. CHADS2VASC => 4 (CHF, HTN, age x 2)-->Xarelto initiated-->converted to sinus w/ pac's  . Chronic diastolic CHF (congestive heart failure) ([place])     a. echo 05/2015: Ef 50-55%, rhythm Afib, moderately dilated aortic root, mild AI, moderate MR, LA normal in size, PASP 44 mmHg    Past Surgical History  Procedure Laterality Date  . Inner ear  surgery    . Hernia repair    . Eye surgery      retinal detatchment  . Colonoscopy with propofol N/A 09/05/2014    Procedure: COLONOSCOPY WITH PROPOFOL;  Surgeon: Manya Silvas, MD;  Location[facility] ENDOSCOPY;  Service: Endoscopy;  Laterality: N/A;     reports that he has never smoked. He does not have any smokeless tobacco history on file. He reports that he does not drink alcohol or use illicit drugs.   family history includes CAD in his father; Hypertension in his father and mother.   Current Outpatient Prescriptions  Medication Sig Dispense Refill  . benzonatate (TESSALON) 200 MG capsule Take 1 capsule (200 mg total) by mouth 3 (three) times daily as needed for cough. 20 capsule 0  . esomeprazole (NEXIUM) 20 MG capsule Take 20 mg by mouth daily at 12 noon.    . fluticasone (FLONASE) 50 MCG/ACT nasal spray Place 2 sprays into both nostrils daily.  5  . furosemide (LASIX) 20 MG tablet Take 2 tablets (40 mg total) by mouth daily. 30 tablet 0  . loratadine (CLARITIN) 10 MG tablet Take 10 mg by mouth daily.    . metoprolol tartrate (LOPRESSOR) 25 MG tablet Take 1 tablet (25 mg total) by mouth as directed. Take 50mg  2 tablets in the morning and 25 mg 1 tablet at night. 60 tablet 11  . Probiotic Product (ALIGN) 4 MG CAPS Take 4 mg by mouth daily.    . rivaroxaban (XARELTO) 20  MG TABS tablet Take 1 tablet (20 mg total) by mouth daily with supper. 30 tablet 0  . tamsulosin (FLOMAX) 0.4 MG CAPS capsule Take 0.4 mg by mouth daily.     Marland Kitchen zolpidem (AMBIEN) 10 MG tablet Take 5 mg by mouth at bedtime as needed for sleep.   0  . potassium chloride (KLOR-CON 10) 10 MEQ tablet Take 2 tablets (20 mEq total) by mouth daily. 60 tablet 11   No current facility-administered medications for this visit.    Allergies: Dexlansoprazole; Escitalopram; Lansoprazole; and Shrimp    PHYSICAL EXAM: VS:  BP 130/58 mmHg  Pulse 111  Ht 5\' 4"  (1.626 m)  Wt 138 lb 8 oz (62.823 kg)  BMI 23.76 kg/m2 , Body mass  index is 23.76 kg/(m^2). Wt Readings from Last 3 Encounters:  06/15/15 138 lb 8 oz (62.823 kg)  06/01/15 159 lb 14.4 oz (72.53 kg)  09/05/14 150 lb (68.04 kg)    GENERAL:  well developed, well nourished, not in acute distress HEENT: normocephalic, pink conjunctivae, anicteric sclerae, no xanthelasma, normal dentition, oropharynx clear NECK:  no neck vein engorgement, JVP normal, no hepatojugular reflux, carotid upstroke brisk and symmetric, no bruit, no thyromegaly, no lymphadenopathy LUNGS:  good respiratory effort, clear to auscultation bilaterally CV:  PMI not displaced, no thrills, no lifts, S1 and S2 within normal limits, no palpable S3 or S4, no murmurs, no rubs, no gallops ABD:  Soft, nontender, nondistended, normoactive bowel sounds, no abdominal aortic bruit, no hepatomegaly, no splenomegaly MS: nontender back, no kyphosis, no scoliosis, no joint deformities EXT:  2+ DP/PT pulses, no edema, no varicosities, no cyanosis, no clubbing SKIN: warm, nondiaphoretic, normal turgor, no ulcers NEUROPSYCH: alert, oriented to person, place, and time, sensory/motor grossly intact, normal mood, appropriate affect  Recent Labs: 05/29/2015: B Natriuretic Peptide 706.0* 06/01/2015: ALT 104*; Magnesium 2.1 06/02/2015: BUN 39*; Creatinine, Ser 1.15; Hemoglobin 13.6; Platelets 189; Potassium 3.9; Sodium 136   Lipid Panel No results found for: CHOL, TRIG, HDL, CHOLHDL, VLDL, LDLCALC, LDLDIRECT   Other studies Reviewed:  EKG:  The ekg from05/05/2015 was personally reviewed by me and it revealedsinus tachycardia 111 BPM, with PACs. LAFB. Nonspecific ST and T-wave changes.  Additional studies/ records that were reviewed personally reviewed by me today include: Echocardiogram 05/30/2015: Left ventricle: The cavity size was normal. Systolic function was  normal. The estimated ejection fraction was in the range of 50%  to 55%. Wall motion was normal; there were no regional wall  motion abnormalities.  The study is not technically sufficient to  allow evaluation of LV diastolic function. - Aortic valve: There was mild regurgitation. - Aorta: Aortic root dimension: 44 mm (ED). - Aortic root: The aortic root was moderately dilated. - Mitral valve: There was mild to moderate regurgitation. - Left atrium: The atrium was normal in size. - Right ventricle: Systolic function was normal. - Pulmonary arteries: Systolic pressure was mild to moderately  elevated. PA peak pressure: 44 mm Hg (S).   ASSESSMENT AND PLAN:   paroxysmal atrial fibrillation   currently sinus rhythm with PACs. Recommend increase metoprolol dose to 15 the morning and 25 and the afternoon. Continue to monitor heart rate. And blood pressure. Check TSH Continue anticoagulation with Xarelto.  Chest pain Shortness of breath  Congestive heart failure, diastolic dysfunction, chronic, not in acute decompensation No evidence of fluid overload on physical examination. Likely New York Heart Association class III. Recommend stress test for evaluation to rule out ischemia in setting of symptoms of  shortness of breath and CP. In the meantime, continue medical therapy. Switch potassium pill 2 smaller tablet. Potassium chloride Klor-Con extended release tablet 10 mEq, 2 tablets daily. Continue same dose of Lasix. Discontinue hydrochlorothiazide since patient is already on furosemide.  Hypertension BP is well controlled. Continue monitoring BP. Continue current medical therapy and lifestyle changes. Continue to monitor blood pressure with medication changes.  Aortic root dilatation Recommend CT angiogram of the chest for evaluation of aortic root dilatation.  Current medicines are reviewed at length with the patient today.  The patient does not have concerns regarding medicines.  Labs/ tests ordered today include:  Orders Placed This Encounter  Procedures  . NM Myocar Multi W/Spect W/Wall Motion / EF  . TSH  . EKG 12-Lead      I had a lengthy and detailed discussion with the patient regarding diagnoses, prognosis, diagnostic options, treatment options , and side effects of medications.   I counseled the patient on importance of lifestyle modification including heart healthy diet, regular physical activity .  I spent at least 40 minutes with the patient today and more than 50% of the time was spent counseling the patient and coordinating care.   Disposition:   FU with undersigned after tests    Signed, Wende Bushy, MD  06/16/2015 12:27 AM    [place]

## 2015-06-16 ENCOUNTER — Telehealth: Payer: Self-pay | Admitting: Cardiology

## 2015-06-16 ENCOUNTER — Other Ambulatory Visit: Payer: Self-pay | Admitting: Cardiology

## 2015-06-16 DIAGNOSIS — I4891 Unspecified atrial fibrillation: Secondary | ICD-10-CM | POA: Insufficient documentation

## 2015-06-16 DIAGNOSIS — I77810 Thoracic aortic ectasia: Secondary | ICD-10-CM | POA: Insufficient documentation

## 2015-06-16 DIAGNOSIS — I5032 Chronic diastolic (congestive) heart failure: Secondary | ICD-10-CM | POA: Insufficient documentation

## 2015-06-16 LAB — TSH: TSH: 1.16 u[IU]/mL (ref 0.450–4.500)

## 2015-06-16 NOTE — Telephone Encounter (Signed)
Pt would like his Furosemide and Benconatate refilled. States he received these in the ED.

## 2015-06-19 ENCOUNTER — Telehealth: Payer: Self-pay | Admitting: *Deleted

## 2015-06-19 DIAGNOSIS — I77819 Aortic ectasia, unspecified site: Secondary | ICD-10-CM

## 2015-06-19 NOTE — Telephone Encounter (Signed)
This encounter was created in error - please disregard.

## 2015-06-19 NOTE — Telephone Encounter (Signed)
Reviewed with patient that he had 3 refills remaining on his furosemide and that he would need to check with his primary care physician for refills on the benconatate. He verbalized understanding and awareness that he had refills available. Also reviewed his instructions for his lexiscan scheduled for tomorrow. We went over time, location, and instructions for testing. He verbalized understanding of all instructions and had no further questions at this time.

## 2015-06-19 NOTE — Telephone Encounter (Signed)
Called patient and let him know that Dr. Yvone Neu wanted to also have a CT angiogram of aorta to evaluate the aortic dilatation. Patient verbalized understanding and test ordered. Transferred patient to scheduling and it was scheduled for 06/29/15 at 10:00AM. Let him know to call back if any further questions or problems and he verbalized understanding of all instructions and information.

## 2015-06-19 NOTE — Telephone Encounter (Signed)
-----   Message from Wende Bushy, MD sent at 06/16/2015 12:22 AM EDT ----- Please order a CTA chest for this patient. I discussed with him and son but i do not think i mentioned to you. My handwriting is so bad. It is for aortic dilatation 43mm on echo. Thank you!

## 2015-06-19 NOTE — Telephone Encounter (Signed)
-----   Message from Valora Corporal, RN sent at 06/19/2015  9:28 AM EDT ----- Could we possibly reschedule this patients follow up appointment? It is scheduled for 06/28/15 with Dr. Yvone Neu and he has some testing that I just scheduled for 06/29/15. So anytime after the 18th would be great. Thanks again for your help with this.  Olin Hauser   ----- Message -----    From: Wende Bushy, MD    Sent: 06/16/2015  12:22 AM      To: Valora Corporal, RN  Please order a CTA chest for this patient. I discussed with him and son but i do not think i mentioned to you. My handwriting is so bad. It is for aortic dilatation 57mm on echo. Thank you!

## 2015-06-20 ENCOUNTER — Encounter
Admission: RE | Admit: 2015-06-20 | Discharge: 2015-06-20 | Disposition: A | Discharge status: Home / Self Care | Payer: Medicare Other | Source: Ambulatory Visit | Attending: Cardiology | Admitting: Cardiology

## 2015-06-20 DIAGNOSIS — I4891 Unspecified atrial fibrillation: Secondary | ICD-10-CM | POA: Insufficient documentation

## 2015-06-20 DIAGNOSIS — R0602 Shortness of breath: Secondary | ICD-10-CM | POA: Diagnosis not present

## 2015-06-20 DIAGNOSIS — R079 Chest pain, unspecified: Secondary | ICD-10-CM | POA: Insufficient documentation

## 2015-06-20 LAB — NM MYOCAR MULTI W/SPECT W/WALL MOTION / EF
CHL CUP NUCLEAR SDS: 0
CSEPHR: 71 %
CSEPPHR: 99 {beats}/min
LV dias vol: 120 mL (ref 62–150)
LVSYSVOL: 67 mL
Rest HR: 72 {beats}/min
SRS: 5
SSS: 0
TID: 1.08

## 2015-06-20 MED ORDER — TECHNETIUM TC 99M SESTAMIBI - CARDIOLITE
13.0000 | Freq: Once | INTRAVENOUS | Status: AC | PRN
  Administered 2015-06-20: 08:00:00 14.074 via INTRAVENOUS

## 2015-06-20 MED ORDER — REGADENOSON 0.4 MG/5ML IV SOLN
0.4000 mg | Freq: Once | INTRAVENOUS | Status: AC
  Administered 2015-06-20: 0.4 mg via INTRAVENOUS
  Filled 2015-06-20: qty 5

## 2015-06-20 MED ORDER — TECHNETIUM TC 99M SESTAMIBI - CARDIOLITE
33.0000 | Freq: Once | INTRAVENOUS | Status: AC | PRN
  Administered 2015-06-20: 09:00:00 32.837 via INTRAVENOUS

## 2015-06-27 ENCOUNTER — Ambulatory Visit: Payer: Medicare Other | Admitting: Cardiology

## 2015-06-28 ENCOUNTER — Ambulatory Visit: Payer: Medicare Other | Admitting: Cardiology

## 2015-06-29 ENCOUNTER — Ambulatory Visit
Admission: RE | Admit: 2015-06-29 | Discharge: 2015-06-29 | Disposition: A | Discharge status: Home / Self Care | Payer: Medicare Other | Source: Ambulatory Visit | Attending: Cardiology | Admitting: Cardiology

## 2015-06-29 DIAGNOSIS — I722 Aneurysm of renal artery: Secondary | ICD-10-CM | POA: Diagnosis not present

## 2015-06-29 DIAGNOSIS — I2510 Atherosclerotic heart disease of native coronary artery without angina pectoris: Secondary | ICD-10-CM | POA: Insufficient documentation

## 2015-06-29 DIAGNOSIS — I77819 Aortic ectasia, unspecified site: Secondary | ICD-10-CM | POA: Insufficient documentation

## 2015-06-29 DIAGNOSIS — K449 Diaphragmatic hernia without obstruction or gangrene: Secondary | ICD-10-CM | POA: Insufficient documentation

## 2015-06-29 DIAGNOSIS — I700 Atherosclerosis of aorta: Secondary | ICD-10-CM | POA: Diagnosis not present

## 2015-06-29 HISTORY — DX: Malignant (primary) neoplasm, unspecified: C80.1

## 2015-06-29 MED ORDER — IOPAMIDOL (ISOVUE-370) INJECTION 76%
75.0000 mL | Freq: Once | INTRAVENOUS | Status: AC | PRN
  Administered 2015-06-29: 75 mL via INTRAVENOUS

## 2015-07-06 ENCOUNTER — Ambulatory Visit (INDEPENDENT_AMBULATORY_CARE_PROVIDER_SITE_OTHER): Payer: Medicare Other | Admitting: Cardiology

## 2015-07-06 ENCOUNTER — Encounter: Payer: Self-pay | Admitting: *Deleted

## 2015-07-06 ENCOUNTER — Telehealth: Payer: Self-pay | Admitting: Cardiology

## 2015-07-06 ENCOUNTER — Encounter: Payer: Self-pay | Admitting: Cardiology

## 2015-07-06 VITALS — BP 118/52 | HR 55 | Ht 64.0 in | Wt 145.5 lb

## 2015-07-06 DIAGNOSIS — I4891 Unspecified atrial fibrillation: Secondary | ICD-10-CM

## 2015-07-06 DIAGNOSIS — R0602 Shortness of breath: Secondary | ICD-10-CM

## 2015-07-06 DIAGNOSIS — I77810 Thoracic aortic ectasia: Secondary | ICD-10-CM | POA: Diagnosis not present

## 2015-07-06 DIAGNOSIS — R079 Chest pain, unspecified: Secondary | ICD-10-CM

## 2015-07-06 DIAGNOSIS — I5032 Chronic diastolic (congestive) heart failure: Secondary | ICD-10-CM | POA: Diagnosis not present

## 2015-07-06 DIAGNOSIS — I77819 Aortic ectasia, unspecified site: Secondary | ICD-10-CM

## 2015-07-06 MED ORDER — METOPROLOL TARTRATE 25 MG PO TABS: 25.0000 mg | ORAL_TABLET | Freq: Two times a day (BID) | ORAL | Status: DC

## 2015-07-06 NOTE — Patient Instructions (Addendum)
Medication Instructions:  Your physician has recommended you make the following change in your medication:  1. Metoprolol 25 mg Twice a day   Labwork: Have your lipid panel checked when you have labs done with your primary care physician.  Testing/Procedures: Non-Cardiac CT Angiography (CTA), is a special type of CT scan that uses a computer to produce multi-dimensional views of major blood vessels throughout the body. In CT angiography, a contrast material is injected through an IV to help visualize the blood vessels  Date & Time: IN 6 MONTHS BEFORE SEEING DR. Yvone Neu  Follow-Up: Your physician wants you to follow-up in: 6 months with Dr. Yvone Neu after CT scan. You will receive a reminder letter in the mail two months in advance. If you don't receive a letter, please call our office to schedule the follow-up appointment.   Any Other Special Instructions Will Be Listed Below (If Applicable).     If you need a refill on your cardiac medications before your next appointment, please call your pharmacy.

## 2015-07-06 NOTE — Telephone Encounter (Signed)
Refill sent for Metoprolol tart 25 mg take one tablet twice a day.

## 2015-07-06 NOTE — Telephone Encounter (Signed)
Pharmacy calling needing to the dose on Metoprolol 25  Instructions say something Take 1 tablet 2 x a day  Take 2 tablet twice a day  Needs to be cleared which one is right.  Please call back.

## 2015-07-06 NOTE — Progress Notes (Signed)
Cardiology Office Note   Date:  07/06/2015   ID:  ODUS PICCOLI, DOB [birth year], MRN [id number]  Referring Doctor:  [place] Pink, MD   Cardiologist:   Wende Bushy, MD   Reason for consultation:  Chief Complaint  Patient presents with  . other    F/U from stress test. Meds reviewed by the patient verbally. Pt. c/o shortness of breath with a stuffy nose.       History of Present Illness: MAXIMILIAN HILDEBRANDT is a 82 y.o. male who presents for follow up after hospital stay.  Pt was seen on his last admission April 15 to  06/02/2015. He was found to have new onset paroxysmal atrial fibrillation, diastolic congestive heart failure, in the setting of pneumonia and other medical issues including hyponatremia.    was discharged on several new medications including furosemide, metoprolol, potassium, Xarelto. He complains that the potassium pill is rather large and difficult for him to swallow.  Patient feels well, no significant recurrence of chest pain. Shortness breath is stable. Not worse than before. He notes probably worsening of his fatigue.   He denies orthopnea, PND, edema.  Patient denies headache, colds, cough.   ROS:  Please see the history of present illness. Aside from mentioned under HPI, all other systems are reviewed and negative.     Past Medical History  Diagnosis Date  . Hypertension   . Rhinitis   . GERD (gastroesophageal reflux disease)   . A-fib ([place])     a. new onset 05/2015; b. CHADS2VASC => 4 (CHF, HTN, age x 2)-->Xarelto initiated-->converted to sinus w/ pac's  . Chronic diastolic CHF (congestive heart failure) ([place])     a. echo 05/2015: Ef 50-55%, rhythm Afib, moderately dilated aortic root, mild AI, moderate MR, LA normal in size, PASP 44 mmHg  . Cancer ([place])     skin ca    Past Surgical History  Procedure Laterality Date  . Inner ear surgery    . Hernia repair    . Eye surgery      retinal detatchment  . Colonoscopy with propofol N/A  09/05/2014    Procedure: COLONOSCOPY WITH PROPOFOL;  Surgeon: Manya Silvas, MD;  Location: [facility] ENDOSCOPY;  Service: Endoscopy;  Laterality: N/A;     reports that he has never smoked. He does not have any smokeless tobacco history on file. He reports that he does not drink alcohol or use illicit drugs.   family history includes CAD in his father; Hypertension in his father and mother.   Current Outpatient Prescriptions  Medication Sig Dispense Refill  . benzonatate (TESSALON) 200 MG capsule Take 1 capsule (200 mg total) by mouth 3 (three) times daily as needed for cough. 20 capsule 0  . esomeprazole (NEXIUM) 20 MG capsule Take 20 mg by mouth daily at 12 noon.    . furosemide (LASIX) 20 MG tablet TAKE 2 TABLETS BY MOUTH DAILY 60 tablet 3  . loratadine (CLARITIN) 10 MG tablet Take 10 mg by mouth daily.    . metoprolol tartrate (LOPRESSOR) 25 MG tablet Take 1 tablet (25 mg total) by mouth 2 (two) times daily. Take 50mg  2 tablets in the morning and 25 mg 1 tablet at night. 60 tablet 11  . potassium chloride (KLOR-CON 10) 10 MEQ tablet Take 2 tablets (20 mEq total) by mouth daily. 60 tablet 11  . Probiotic Product (ALIGN) 4 MG CAPS Take 4 mg by mouth daily.    . rivaroxaban (XARELTO) 20 MG  TABS tablet Take 1 tablet (20 mg total) by mouth daily with supper. 30 tablet 0  . sodium chloride (OCEAN) 0.65 % nasal spray Place 1 spray into the nose as needed for congestion.    . tamsulosin (FLOMAX) 0.4 MG CAPS capsule Take 0.4 mg by mouth daily.     Marland Kitchen zolpidem (AMBIEN) 10 MG tablet Take 5 mg by mouth at bedtime as needed for sleep.   0   No current facility-administered medications for this visit.    Allergies: Dexlansoprazole; Escitalopram; Lansoprazole; and Shrimp    PHYSICAL EXAM: VS:  BP 118/52 mmHg  Pulse 55  Ht 5\' 4"  (1.626 m)  Wt 145 lb 8 oz (65.998 kg)  BMI 24.96 kg/m2  SpO2 98% , Body mass index is 24.96 kg/(m^2). Wt Readings from Last 3 Encounters:  07/06/15 145 lb 8 oz (65.998  kg)  06/15/15 138 lb 8 oz (62.823 kg)  06/01/15 159 lb 14.4 oz (72.53 kg)    GENERAL:  well developed, well nourished, not in acute distress HEENT: normocephalic, pink conjunctivae, anicteric sclerae, no xanthelasma, normal dentition, oropharynx clear NECK:  no neck vein engorgement, JVP normal, no hepatojugular reflux, carotid upstroke brisk and symmetric, no bruit, no thyromegaly, no lymphadenopathy LUNGS:  good respiratory effort, clear to auscultation bilaterally CV:  PMI not displaced, no thrills, no lifts, S1 and S2 within normal limits, no palpable S3 or S4, no murmurs, no rubs, no gallops ABD:  Soft, nontender, nondistended, normoactive bowel sounds, no abdominal aortic bruit, no hepatomegaly, no splenomegaly MS: nontender back, no kyphosis, no scoliosis, no joint deformities EXT:  2+ DP/PT pulses, no edema, no varicosities, no cyanosis, no clubbing SKIN: warm, nondiaphoretic, normal turgor, no ulcers NEUROPSYCH: alert, oriented to person, place, and time, sensory/motor grossly intact, normal mood, appropriate affect  Recent Labs: 05/29/2015: B Natriuretic Peptide 706.0* 06/01/2015: ALT 104*; Magnesium 2.1 06/02/2015: BUN 39*; Creatinine, Ser 1.15; Hemoglobin 13.6; Platelets 189; Potassium 3.9; Sodium 136 06/15/2015: TSH 1.160   Lipid Panel No results found for: CHOL, TRIG, HDL, CHOLHDL, VLDL, LDLCALC, LDLDIRECT   Other studies Reviewed:  EKG:  The ekg from05/05/2015 was personally reviewed by me and it revealedsinus tachycardia 111 BPM, with PACs. LAFB. Nonspecific ST and T-wave changes.  EKG from 07/06/2015 was personally reviewed by me and it showed sinus bradycardia, 55 BPM, LAFB.  Additional studies/ records that were reviewed personally reviewed by me today include: Echocardiogram 05/30/2015: Left ventricle: The cavity size was normal. Systolic function was  normal. The estimated ejection fraction was in the range of 50%  to 55%. Wall motion was normal; there were no  regional wall  motion abnormalities. The study is not technically sufficient to  allow evaluation of LV diastolic function. - Aortic valve: There was mild regurgitation. - Aorta: Aortic root dimension: 44 mm (ED). - Aortic root: The aortic root was moderately dilated. - Mitral valve: There was mild to moderate regurgitation. - Left atrium: The atrium was normal in size. - Right ventricle: Systolic function was normal. - Pulmonary arteries: Systolic pressure was mild to moderately  elevated. PA peak pressure: 44 mm Hg (S).  Pharmacologic stress test 06/20/2015: Pharmacological myocardial perfusion imaging study with no significant Ischemia Significant GI uptake artifact noted affecting visualization of the inferior wall Grossly normal perfusion of the inferior wall though estimation of wall motion of this region is compromised EF estimated at 34% (likely depressed secondary to GI uptake artifact) No EKG changes concerning for ischemia at peak stress or in recovery. Low  risk scan Consider echocardiogram if clinically indicated to confirm ejection fraction  CTA chest 06/29/2015: 4.4 cm dilatation of the aortic root, normal in caliber distally. 2. Atherosclerosis, including aortic and coronary artery disease.   ASSESSMENT AND PLAN:   paroxysmal atrial fibrillation   currently sinus rhythm Recommend metoprolol 25 milligrams the morning and 25 and the afternoon. Continue to monitor heart rate. And blood pressure. Continue anticoagulation with Xarelto.  Chest pain Shortness of breath No ischemia noted on stress testing. Patient reassured.  Congestive heart failure, diastolic dysfunction, chronic, not in acute decompensation No evidence of fluid overload on physical examination. Likely New York Heart Association class III. continue medical therapy.   Coronary Atherosclerosis noted on CT of the chest Recommend medical therapy. No ischemia on stress test.  Hypertension BP is  well controlled. Continue monitoring BP. Continue current medical therapy and lifestyle changes. Continue to monitor blood pressure with medication changes.  Aortic root dilatation 4.4 cm aortic root noted on CT scan chest. Recommend serial evaluation every 6 months.   Current medicines are reviewed at length with the patient today.  The patient does not have concerns regarding medicines.  Labs/ tests ordered today include:  Orders Placed This Encounter  Procedures  . CT Angio Chest/Abd/Pel for Dissection W and/or W/WO  . EKG 12-Lead    I had a lengthy and detailed discussion with the patient regarding diagnoses, prognosis, diagnostic options, treatment options , and side effects of medications.   I counseled the patient on importance of lifestyle modification including heart healthy diet, regular physical activity .    Disposition:   FU with undersigned In 6 months  Signed, Wende Bushy, MD  07/06/2015 12:29 PM    [place]

## 2015-07-17 ENCOUNTER — Telehealth: Payer: Self-pay | Admitting: Cardiology

## 2015-07-17 NOTE — Telephone Encounter (Signed)
Spoke w/ pt.  He thinks he is having a reaction to lasix. He reports that he read the pamphlet included w/ his lasix and he has every side effect listed.  Also reports that his hearing decreases for 2-3 hours after taking the pill. Pt has diarrhea, as well.  Advised him to hold lasix for 2-3 days to see if sx improve.  Advised him closely watch his BP and daily wts while holding and call the office in 2-3 days and let us know how he is doing.  He is agreeable and will call back on Wed or Thurs.

## 2015-07-17 NOTE — Telephone Encounter (Signed)
Pt c/o medication issue:  1. Name of Medication: lasix   2. How are you currently taking this medication (dosage and times per day)? 20 mg po x 2 daily   3. Are you having a reaction (difficulty breathing--STAT)?  Headache dizzy blurry vision neck and back muscle tightness weakness tiredness   Constant  Ringing in ear  4. What is your medication issue?   Patient thinks he is having a reaction   PLEASE CALL TO LET HIM KNOW IF HE SHOULD STOP OR CONTINUE

## 2015-07-21 ENCOUNTER — Telehealth: Payer: Self-pay | Admitting: Cardiology

## 2015-07-21 NOTE — Telephone Encounter (Signed)
Patient called and regarding furosemide (LASIX) 20 MG tablet and potassium chloride (KLOR-CON 10) 10 MEQ tablet. The furosemide has been stopped on 07/18/15 and the postassium due to dizziness and drowsiness, headache, blurred vision, weakness and aches and pains. Can he take another medicine?

## 2015-07-21 NOTE — Telephone Encounter (Signed)
Pls let him know he may need to speak with his PCP about his myriad of symptoms as they may not all be from lasix (small dose). Alternative is another diuretic but lasix is milder than torsemide, etc.

## 2015-07-21 NOTE — Telephone Encounter (Signed)
Patient states that his symptoms are now much better (hearing, dizziness, headache, aches, pains, etc.) so he really doesn't want to start back on those at this time. Let him know that Dr. Yvone Neu did not feel his symptoms were from the medications and that he may benefit from seeing his primary care physician for those symptoms. He continued to express that he felt this was the source of his problem so offered to schedule a follow up to come in and speak with Dr. Yvone Neu to discuss his concerns. He verbalized agreement and has an appointment 07/26/15 at 11:45AM. He verbalized understanding of our conversation and had no further questions.

## 2015-07-21 NOTE — Telephone Encounter (Signed)
See note below

## 2015-07-26 ENCOUNTER — Encounter: Payer: Self-pay | Admitting: Cardiology

## 2015-07-26 ENCOUNTER — Ambulatory Visit (INDEPENDENT_AMBULATORY_CARE_PROVIDER_SITE_OTHER): Payer: Medicare Other | Admitting: Cardiology

## 2015-07-26 VITALS — BP 134/60 | HR 68 | Ht 64.0 in | Wt 139.5 lb

## 2015-07-26 DIAGNOSIS — I77819 Aortic ectasia, unspecified site: Secondary | ICD-10-CM | POA: Diagnosis not present

## 2015-07-26 DIAGNOSIS — I5032 Chronic diastolic (congestive) heart failure: Secondary | ICD-10-CM

## 2015-07-26 DIAGNOSIS — I4891 Unspecified atrial fibrillation: Secondary | ICD-10-CM

## 2015-07-26 DIAGNOSIS — R5383 Other fatigue: Secondary | ICD-10-CM | POA: Diagnosis not present

## 2015-07-26 NOTE — Addendum Note (Signed)
Addended by: Valora Corporal on: 07/26/2015 01:49 PM   Modules accepted: Orders, Medications

## 2015-07-26 NOTE — Patient Instructions (Addendum)
Medication Instructions:  Your physician has recommended you make the following change in your medication:  1.STOP lasix   MONITOR your weight daily and keep log   Labwork: None ordered  Testing/Procedures: CTA in 6 months prior to office visit.  Follow-Up: Your physician wants you to follow-up in: 6 months with Dr. Yvone Neu. You will receive a reminder letter in the mail two months in advance. If you don't receive a letter, please call our office to schedule the follow-up appointment.   Any Other Special Instructions Will Be Listed Below (If Applicable).     If you need a refill on your cardiac medications before your next appointment, please call your pharmacy.

## 2015-07-26 NOTE — Progress Notes (Signed)
Cardiology Office Note   Date:  07/26/2015   ID:  Derek James, DOB 1930/03/09, MRN [id number]  Referring Doctor:  Derek Pink, MD   Cardiologist:   Derek Bushy, MD   Reason for consultation:  Chief Complaint  Patient presents with  . other    Pt. would like to discuss Lasix & metoprolol.  Meds reviewed by the patient verbally.       History of Present Illness: Derek James is a 82 y.o. male who presents for follow up with questions regarding his meds.   Patient has looked into side effect profile of Lasix as well as metoprolol. He thinks that a lot of his symptoms of difficulty hearing, weakness, dizziness for related to these medications. He decided to stop her Lasix and since then he feels much better. He would like to continue with holding the Lasix.  He does not have any chest pain or shortness of breath. According to family present in the room, he has not been back to doing his usual routine. He has a lot in his mind and that may be worrying him too much and keeping him up at night. Or all his life, his reading not been a good sleeper and usually just a few hours of sleep at night. His main issue is fatigue. And poor appetite.  Pt was seen on his last admission April 15 to  06/02/2015. He was found to have new onset paroxysmal atrial fibrillation, diastolic congestive heart failure, in the setting of pneumonia and other medical issues including hyponatremia.   He denies orthopnea, PND, edema.  Patient denies headache, colds, cough.   ROS:  Please see the history of present illness. Aside from mentioned under HPI, all other systems are reviewed and negative.     Past Medical History  Diagnosis Date  . Hypertension   . Rhinitis   . GERD (gastroesophageal reflux disease)   . A-fib ([place])     a. new onset 05/2015; b. CHADS2VASC => 4 (CHF, HTN, age x 2)-->Xarelto initiated-->converted to sinus w/ pac's  . Chronic diastolic CHF (congestive heart failure)  ([place])     a. echo 05/2015: Ef 50-55%, rhythm Afib, moderately dilated aortic root, mild AI, moderate MR, LA normal in size, PASP 44 mmHg  . Cancer ([place])     skin ca    Past Surgical History  Procedure Laterality Date  . Inner ear surgery    . Hernia repair    . Eye surgery      retinal detatchment  . Colonoscopy with propofol N/A 09/05/2014    Procedure: COLONOSCOPY WITH PROPOFOL;  Surgeon: Manya Silvas, MD;  Location: [facility] ENDOSCOPY;  Service: Endoscopy;  Laterality: N/A;     reports that he has never smoked. He does not have any smokeless tobacco history on file. He reports that he does not drink alcohol or use illicit drugs.   family history includes CAD in his father; Hypertension in his father and mother.   Current Outpatient Prescriptions  Medication Sig Dispense Refill  . benzonatate (TESSALON) 200 MG capsule Take 1 capsule (200 mg total) by mouth 3 (three) times daily as needed for cough. 20 capsule 0  . esomeprazole (NEXIUM) 20 MG capsule Take 20 mg by mouth daily at 12 noon.    . fluticasone (FLONASE) 50 MCG/ACT nasal spray Place into both nostrils daily.    Marland Kitchen loratadine (CLARITIN) 10 MG tablet Take 10 mg by mouth daily.    . metoprolol  tartrate (LOPRESSOR) 25 MG tablet Take 1 tablet (25 mg total) by mouth 2 (two) times daily. 60 tablet 11  . Probiotic Product (ALIGN) 4 MG CAPS Take 4 mg by mouth daily.    . rivaroxaban (XARELTO) 20 MG TABS tablet Take 1 tablet (20 mg total) by mouth daily with supper. 30 tablet 0  . tamsulosin (FLOMAX) 0.4 MG CAPS capsule Take 0.4 mg by mouth daily.     Marland Kitchen zolpidem (AMBIEN) 10 MG tablet Take 5 mg by mouth at bedtime as needed for sleep.   0  . furosemide (LASIX) 20 MG tablet TAKE 2 TABLETS BY MOUTH DAILY (Patient not taking: Reported on 07/26/2015) 60 tablet 3  . potassium chloride (KLOR-CON 10) 10 MEQ tablet Take 2 tablets (20 mEq total) by mouth daily. (Patient not taking: Reported on 07/26/2015) 60 tablet 11   No current  facility-administered medications for this visit.    Allergies: Dexlansoprazole; Escitalopram; Lansoprazole; and Shrimp    PHYSICAL EXAM: VS:  BP 134/60 mmHg  Pulse 68  Ht 5\' 4"  (1.626 m)  Wt 139 lb 8 oz (63.277 kg)  BMI 23.93 kg/m2  SpO2 98% , Body mass index is 23.93 kg/(m^2). Wt Readings from Last 3 Encounters:  07/26/15 139 lb 8 oz (63.277 kg)  07/06/15 145 lb 8 oz (65.998 kg)  06/15/15 138 lb 8 oz (62.823 kg)    GENERAL:  well developed, well nourished, not in acute distress HEENT: normocephalic, James conjunctivae, anicteric sclerae, no xanthelasma, normal dentition, oropharynx clear NECK:  no neck vein engorgement, JVP normal, no hepatojugular reflux, carotid upstroke brisk and symmetric, no bruit, no thyromegaly, no lymphadenopathy LUNGS:  good respiratory effort, clear to auscultation bilaterally CV:  PMI not displaced, no thrills, no lifts, S1 and S2 within normal limits, no palpable S3 or S4, no murmurs, no rubs, no gallops ABD:  Soft, nontender, nondistended, normoactive bowel sounds, no abdominal aortic bruit, no hepatomegaly, no splenomegaly MS: nontender back, no kyphosis, no scoliosis, no joint deformities EXT:  2+ DP/PT pulses, no edema, no varicosities, no cyanosis, no clubbing SKIN: warm, nondiaphoretic, normal turgor, no ulcers NEUROPSYCH: alert, oriented to person, place, and time, sensory/motor grossly intact, normal mood, appropriate affect  Recent Labs: 05/29/2015: B Natriuretic Peptide 706.0* 06/01/2015: ALT 104*; Magnesium 2.1 06/02/2015: BUN 39*; Creatinine, Ser 1.15; Hemoglobin 13.6; Platelets 189; Potassium 3.9; Sodium 136 06/15/2015: TSH 1.160   Lipid Panel No results found for: CHOL, TRIG, HDL, CHOLHDL, VLDL, LDLCALC, LDLDIRECT   Other studies Reviewed:  EKG:  The ekg from05/05/2015 was personally reviewed by me and it revealedsinus tachycardia 111 BPM, with PACs. LAFB. Nonspecific ST and T-wave changes.  EKG from 07/06/2015 was personally reviewed  by me and it showed sinus bradycardia, 55 BPM, LAFB.  Additional studies/ records that were reviewed personally reviewed by me today include: Echocardiogram 05/30/2015: Left ventricle: The cavity size was normal. Systolic function was  normal. The estimated ejection fraction was in the range of 50%  to 55%. Wall motion was normal; there were no regional wall  motion abnormalities. The study is not technically sufficient to  allow evaluation of LV diastolic function. - Aortic valve: There was mild regurgitation. - Aorta: Aortic root dimension: 44 mm (ED). - Aortic root: The aortic root was moderately dilated. - Mitral valve: There was mild to moderate regurgitation. - Left atrium: The atrium was normal in size. - Right ventricle: Systolic function was normal. - Pulmonary arteries: Systolic pressure was mild to moderately  elevated. PA peak pressure:  44 mm Hg (S).  Pharmacologic stress test 06/20/2015: Pharmacological myocardial perfusion imaging study with no significant Ischemia Significant GI uptake artifact noted affecting visualization of the inferior wall Grossly normal perfusion of the inferior wall though estimation of wall motion of this region is compromised EF estimated at 34% (likely depressed secondary to GI uptake artifact) No EKG changes concerning for ischemia at peak stress or in recovery. Low risk scan Consider echocardiogram if clinically indicated to confirm ejection fraction  CTA chest 06/29/2015: 4.4 cm dilatation of the aortic root, normal in caliber distally. 2. Atherosclerosis, including aortic and coronary artery disease.   ASSESSMENT AND PLAN:  Fatigue A very nonspecific complaint Based on cardiac workup, there is no evidence that he has active cardiac issues going on that would be causing this symptom. Advised patient to discuss this with his PCP, he may need a workup for continued fatigue as well as poor appetite. Also discussed importance of  improving sleep habit as this may be playing a big role in his symptoms. We also had a lengthy talk about how he feels after being in the hospital. He has not really been back to doing his usual physical activity. When asked, there really is no chest pain or shortness of breath that stops him from doing these things. He realizes that he just needs to go back to doing these things and not push himself too much in terms of exercise. He also realized that he may be worrying too much about the little setbacks that he's had since being in the hospital.   paroxysmal atrial fibrillation   currently sinus rhythm Recommend metoprolol 25 milligrams the morning and 25 and the afternoon. Continue to monitor heart rate. And blood pressure. Continue anticoagulation with Xarelto.  Chest pain Shortness of breath No ischemia noted on stress testing. Patient reassured.  Congestive heart failure, diastolic dysfunction, chronic, not in acute decompensation No evidence of fluid overload on physical examination. Likely New York Heart Association class III. Patient has decided to discontinue Lasix due to side effects. Since stopping the medication, he actually feels much better. Will agree with this for now. Advised to monitor weights on a daily basis. Patient to call our office he if he gains more than 2 pounds overnight or more than 5 pounds in a week.  Coronary Atherosclerosis noted on CT of the chest Recommend medical therapy. No ischemia on stress test.  Hypertension BP is well controlled. Continue monitoring BP. Continue current medical therapy and lifestyle changes. Continue to monitor blood pressure with medication changes.  Aortic root dilatation 4.4 cm aortic root noted on CT scan chest. Recommend serial evaluation every 6 months.   Current medicines are reviewed at length with the patient today.  The patient does not have concerns regarding medicines.  Labs/ tests ordered today include:  No  orders of the defined types were placed in this encounter.    I had a lengthy and detailed discussion with the patient regarding diagnoses, prognosis, diagnostic options, treatment options , and side effects of medications.   I counseled the patient on importance of lifestyle modification including heart healthy diet, regular physical activity .    Disposition:   FU with undersigned In 6 months  I spent at least 45 minutes with the patient today and more than 50% of the time was spent counseling the patient and coordinating care.    Signed, Derek Bushy, MD  07/26/2015 12:39 PM    [place]

## 2015-07-31 ENCOUNTER — Telehealth: Payer: Self-pay | Admitting: Cardiology

## 2015-07-31 NOTE — Telephone Encounter (Signed)
Pt c/o BP issue: STAT if pt c/o blurred vision, one-sided weakness or slurred speech  1. What are your last 5 BP readings? Range 120-130/53-60  2. Are you having any other symptoms (ex. Dizziness, headache, blurred vision, passed out)? No energy, very weak, feel drowsy.  3. What is your BP issue? Too low States nothing has changed since last week at him appointment. He thinks he is taking too much medication.

## 2015-07-31 NOTE — Telephone Encounter (Signed)
Patient called in with blood pressure ranges from 120/130 to 56/60. Let him know that those were within normal range and they sound good. He continues to report fatigue and just not feeling well. Per Dr. Tora Kindred note referred him to PCP for continued workup of fatigue and loss of appetite. He reports that he just wanted to check and see if that remained her recommendation to check with PCP. Let him know that he should continue to monitor his blood pressures and that if it should drop lower to please call us back. Instructed him to continue taking medications as directed and to call back if any further problems. He verbalized understanding and had no further questions at this time.

## 2015-12-15 ENCOUNTER — Ambulatory Visit
Admission: RE | Admit: 2015-12-15 | Discharge: 2015-12-15 | Disposition: A | Discharge status: Home / Self Care | Payer: Medicare Other | Source: Ambulatory Visit | Attending: Cardiology | Admitting: Cardiology

## 2015-12-15 ENCOUNTER — Ambulatory Visit: Payer: Medicare Other

## 2015-12-15 DIAGNOSIS — I712 Thoracic aortic aneurysm, without rupture: Secondary | ICD-10-CM | POA: Insufficient documentation

## 2015-12-15 DIAGNOSIS — I77810 Thoracic aortic ectasia: Secondary | ICD-10-CM | POA: Diagnosis present

## 2015-12-15 DIAGNOSIS — I77819 Aortic ectasia, unspecified site: Secondary | ICD-10-CM

## 2015-12-15 MED ORDER — IOPAMIDOL (ISOVUE-370) INJECTION 76%
100.0000 mL | Freq: Once | INTRAVENOUS | Status: AC | PRN
  Administered 2015-12-15: 100 mL via INTRAVENOUS

## 2015-12-19 LAB — POCT I-STAT CREATININE: CREATININE: 1 mg/dL (ref 0.61–1.24)

## 2016-01-16 ENCOUNTER — Ambulatory Visit (INDEPENDENT_AMBULATORY_CARE_PROVIDER_SITE_OTHER): Payer: Medicare Other | Admitting: Cardiology

## 2016-01-16 ENCOUNTER — Encounter: Payer: Self-pay | Admitting: Cardiology

## 2016-01-16 VITALS — BP 120/58 | HR 59 | Ht 64.0 in | Wt 145.5 lb

## 2016-01-16 DIAGNOSIS — I5032 Chronic diastolic (congestive) heart failure: Secondary | ICD-10-CM

## 2016-01-16 DIAGNOSIS — I4891 Unspecified atrial fibrillation: Secondary | ICD-10-CM

## 2016-01-16 DIAGNOSIS — I77810 Thoracic aortic ectasia: Secondary | ICD-10-CM | POA: Diagnosis not present

## 2016-01-16 DIAGNOSIS — I10 Essential (primary) hypertension: Secondary | ICD-10-CM

## 2016-01-16 NOTE — Patient Instructions (Signed)
Follow-Up: Your physician wants you to follow-up in: 6 months with Dr. Ingal. You will receive a reminder letter in the mail two months in advance. If you don't receive a letter, please call our office to schedule the follow-up appointment.  It was a pleasure seeing you today here in the office. Please do not hesitate to give us a call back if you have any further questions. [phone]  Pamela A. RN, BSN    

## 2016-01-16 NOTE — Progress Notes (Signed)
Cardiology Office Note   Date:  01/16/2016   ID:  Derek James, DOB 1930-10-01, MRN [id number]  Referring Doctor:  Derek Pink, MD   Cardiologist:   Derek Bushy, MD   Reason for consultation:  Chief Complaint  Patient presents with  . other    51mo f/u. Pt states he is doing fair. Reviewed med with pt verbally.      History of Present Illness: Derek James is a 82 y.o. male who presents for follow up   Since last visit, patient has discomfort mold, black mold, in his kitchen and different areas in the house. Since then, he is undergone replacement of the affected structures, floors, carpets. He firmly believes that this had a lot to do with his development of pneumonia previously.   He does not have any chest pain or shortness of breath. He denies orthopnea, PND, edema. Patient denies headache, colds, cough.   ROS:  Please see the history of present illness. Aside from mentioned under HPI, all other systems are reviewed and negative.     Past Medical History:  Diagnosis Date  . A-fib ([place])    a. new onset 05/2015; b. CHADS2VASC => 4 (CHF, HTN, age x 2)-->Xarelto initiated-->converted to sinus w/ pac's  . Cancer ([place])    skin ca  . Chronic diastolic CHF (congestive heart failure) ([place])    a. echo 05/2015: Ef 50-55%, rhythm Afib, moderately dilated aortic root, mild AI, moderate MR, LA normal in size, PASP 44 mmHg  . GERD (gastroesophageal reflux disease)   . Hypertension   . Rhinitis     Past Surgical History:  Procedure Laterality Date  . COLONOSCOPY WITH PROPOFOL N/A 09/05/2014   Procedure: COLONOSCOPY WITH PROPOFOL;  Surgeon: Derek Silvas, MD;  Location: [facility] ENDOSCOPY;  Service: Endoscopy;  Laterality: N/A;  . EYE SURGERY     retinal detatchment  . HERNIA REPAIR    . INNER EAR SURGERY       reports that he has never smoked. He has never used smokeless tobacco. He reports that he does not drink alcohol or use drugs.   family history includes  CAD in his father; Hypertension in his father and mother.   Current Outpatient Prescriptions  Medication Sig Dispense Refill  . azelastine (ASTELIN) 0.1 % nasal spray Place 1 spray into both nostrils 2 (two) times daily. Use in each nostril as directed    . benzonatate (TESSALON) 200 MG capsule Take 1 capsule (200 mg total) by mouth 3 (three) times daily as needed for cough. 20 capsule 0  . esomeprazole (NEXIUM) 20 MG capsule Take 20 mg by mouth daily at 12 noon.    . loratadine (CLARITIN) 10 MG tablet Take 10 mg by mouth daily.    . metoprolol tartrate (LOPRESSOR) 25 MG tablet Take 1 tablet (25 mg total) by mouth 2 (two) times daily. 60 tablet 11  . mirtazapine (REMERON) 30 MG tablet Take 30 mg by mouth at bedtime.    . Probiotic Product (ALIGN) 4 MG CAPS Take 4 mg by mouth daily.    . rivaroxaban (XARELTO) 20 MG TABS tablet Take 1 tablet (20 mg total) by mouth daily with supper. 30 tablet 0  . tamsulosin (FLOMAX) 0.4 MG CAPS capsule Take 0.4 mg by mouth daily.     Marland Kitchen zolpidem (AMBIEN) 10 MG tablet Take 5 mg by mouth at bedtime as needed for sleep.   0   No current facility-administered medications for this visit.  Allergies: Dexlansoprazole; Escitalopram; Lansoprazole; and Shrimp [shellfish allergy]    PHYSICAL EXAM: VS:  BP (!) 120/58 (BP Location: Left Arm, Patient Position: Sitting, Cuff Size: Normal)   Pulse (!) 59   Ht 5\' 4"  (1.626 m)   Wt 145 lb 8 oz (66 kg)   BMI 24.98 kg/m  , Body mass index is 24.98 kg/m. Wt Readings from Last 3 Encounters:  01/16/16 145 lb 8 oz (66 kg)  07/26/15 139 lb 8 oz (63.3 kg)  07/06/15 145 lb 8 oz (66 kg)    GENERAL:  well developed, well nourished, not in acute distress HEENT: normocephalic, James conjunctivae, anicteric sclerae, no xanthelasma, normal dentition, oropharynx clear NECK:  no neck vein engorgement, JVP normal, no hepatojugular reflux, carotid upstroke brisk and symmetric, no bruit, no thyromegaly, no lymphadenopathy LUNGS:   good respiratory effort, clear to auscultation bilaterally CV:  PMI not displaced, no thrills, no lifts, S1 and S2 within normal limits, no palpable S3 or S4, no murmurs, no rubs, no gallops ABD:  Soft, nontender, nondistended, normoactive bowel sounds, no abdominal aortic bruit, no hepatomegaly, no splenomegaly MS: nontender back, no kyphosis, no scoliosis, no joint deformities EXT:  2+ DP/PT pulses, no edema, no varicosities, no cyanosis, no clubbing SKIN: warm, nondiaphoretic, normal turgor, no ulcers NEUROPSYCH: alert, oriented to person, place, and time, sensory/motor grossly intact, normal mood, appropriate affect  Recent Labs: 05/29/2015: B Natriuretic Peptide 706.0 06/01/2015: ALT 104; Magnesium 2.1 06/02/2015: BUN 39; Hemoglobin 13.6; Platelets 189; Potassium 3.9; Sodium 136 06/15/2015: TSH 1.160 12/15/2015: Creatinine, Ser 1.00   Lipid Panel No results found for: CHOL, TRIG, HDL, CHOLHDL, VLDL, LDLCALC, LDLDIRECT   Other studies Reviewed:  EKG:  The ekg from05/05/2015 was personally reviewed by me and it revealedsinus tachycardia 111 BPM, with PACs. LAFB. Nonspecific ST and T-wave changes.  EKG from 07/06/2015 was personally reviewed by me and it showed sinus bradycardia, 55 BPM, LAFB.  EKG from 01/16/2016 was personally reviewed by me and it showed sinus bradycardic, 59 BPM. LAFB. Nonspecific ST changes. No significant change from previous.  Additional studies/ records that were reviewed personally reviewed by me today include: Echocardiogram 05/30/2015: Left ventricle: The cavity size was normal. Systolic function was  normal. The estimated ejection fraction was in the range of 50%  to 55%. Wall motion was normal; there were no regional wall  motion abnormalities. The study is not technically sufficient to  allow evaluation of LV diastolic function. - Aortic valve: There was mild regurgitation. - Aorta: Aortic root dimension: 44 mm (ED). - Aortic root: The aortic root was  moderately dilated. - Mitral valve: There was mild to moderate regurgitation. - Left atrium: The atrium was normal in size. - Right ventricle: Systolic function was normal. - Pulmonary arteries: Systolic pressure was mild to moderately  elevated. PA peak pressure: 44 mm Hg (S).  Pharmacologic stress test 06/20/2015: Pharmacological myocardial perfusion imaging study with no significant Ischemia Significant GI uptake artifact noted affecting visualization of the inferior wall Grossly normal perfusion of the inferior wall though estimation of wall motion of this region is compromised EF estimated at 34% (likely depressed secondary to GI uptake artifact) No EKG changes concerning for ischemia at peak stress or in recovery. Low risk scan Consider echocardiogram if clinically indicated to confirm ejection fraction  CTA chest 06/29/2015: 4.4 cm dilatation of the aortic root, normal in caliber distally. 2. Atherosclerosis, including aortic and coronary artery disease.  CTA 12/15/2015: Stable dilatation of the thoracic aorta, aortic root maximum  of 4.4 cm. Recommend repeat imaging in 1 year. Stable left renal artery aneurysm, we will reassess together with the thoracic aorta in 1 year.  ASSESSMENT AND PLAN: paroxysmal atrial fibrillation   currently sinus rhythm Recommend metoprolol 25 milligrams the morning and 25 and the afternoon. Continue to monitor heart rate. And blood pressure. Continue anticoagulation with Xarelto.  Chest pain Shortness of breath No ischemia noted on stress testing. Patient reassured.  Congestive heart failure, diastolic dysfunction, chronic, not in acute decompensation No evidence of fluid overload on physical examination. Patient decided to stop Lasix several months ago and has actually done very well. No evidence of fluid overload.  Coronary Atherosclerosis noted on CT of the chest Recommend medical therapy. No ischemia on stress test.  Hypertension BP is  well controlled. Continue monitoring BP. Continue current medical therapy and lifestyle changes. Continue to monitor blood pressure with medication changes.  Aortic root dilatation 4.4 cm aortic root noted on CT scan chest. Recommend to repeat imaging November 2018.   Current medicines are reviewed at length with the patient today.  The patient does not have concerns regarding medicines.  Labs/ tests ordered today include:  Orders Placed This Encounter  Procedures  . EKG 12-Lead    I had a lengthy and detailed discussion with the patient regarding diagnoses, prognosis, diagnostic options, treatment options , and side effects of medications.   I counseled the patient on importance of lifestyle modification including heart healthy diet, regular physical activity .    Disposition:   FU with undersigned In 6 months  Signed, Derek Bushy, MD  01/16/2016 12:04 PM    [place]

## 2016-03-23 ENCOUNTER — Encounter: Payer: Self-pay | Admitting: Medical Oncology

## 2016-03-23 ENCOUNTER — Emergency Department
Admission: EM | Admit: 2016-03-23 | Discharge: 2016-03-23 | Disposition: A | Discharge status: Home / Self Care | Payer: Medicare Other | Attending: Emergency Medicine | Admitting: Emergency Medicine

## 2016-03-23 ENCOUNTER — Emergency Department: Payer: Medicare Other

## 2016-03-23 DIAGNOSIS — J09X2 Influenza due to identified novel influenza A virus with other respiratory manifestations: Secondary | ICD-10-CM | POA: Insufficient documentation

## 2016-03-23 DIAGNOSIS — J111 Influenza due to unidentified influenza virus with other respiratory manifestations: Secondary | ICD-10-CM

## 2016-03-23 DIAGNOSIS — R05 Cough: Secondary | ICD-10-CM

## 2016-03-23 DIAGNOSIS — Z85828 Personal history of other malignant neoplasm of skin: Secondary | ICD-10-CM | POA: Insufficient documentation

## 2016-03-23 DIAGNOSIS — I110 Hypertensive heart disease with heart failure: Secondary | ICD-10-CM | POA: Diagnosis not present

## 2016-03-23 DIAGNOSIS — R059 Cough, unspecified: Secondary | ICD-10-CM

## 2016-03-23 DIAGNOSIS — R197 Diarrhea, unspecified: Secondary | ICD-10-CM | POA: Diagnosis present

## 2016-03-23 DIAGNOSIS — I5032 Chronic diastolic (congestive) heart failure: Secondary | ICD-10-CM | POA: Diagnosis not present

## 2016-03-23 DIAGNOSIS — Z7901 Long term (current) use of anticoagulants: Secondary | ICD-10-CM | POA: Diagnosis not present

## 2016-03-23 LAB — COMPREHENSIVE METABOLIC PANEL
ALBUMIN: 4 g/dL (ref 3.5–5.0)
ALK PHOS: 42 U/L (ref 38–126)
ALT: 23 U/L (ref 17–63)
ANION GAP: 10 (ref 5–15)
AST: 52 U/L — ABNORMAL HIGH (ref 15–41)
BILIRUBIN TOTAL: 1.2 mg/dL (ref 0.3–1.2)
BUN: 19 mg/dL (ref 6–20)
CALCIUM: 8.6 mg/dL — AB (ref 8.9–10.3)
CO2: 25 mmol/L (ref 22–32)
Chloride: 97 mmol/L — ABNORMAL LOW (ref 101–111)
Creatinine, Ser: 1.28 mg/dL — ABNORMAL HIGH (ref 0.61–1.24)
GFR calc Af Amer: 58 mL/min — ABNORMAL LOW (ref >60)
GFR calc non Af Amer: 50 mL/min — ABNORMAL LOW (ref >60)
GLUCOSE: 98 mg/dL (ref 65–99)
Potassium: 3.6 mmol/L (ref 3.5–5.1)
SODIUM: 132 mmol/L — AB (ref 135–145)
Total Protein: 7.2 g/dL (ref 6.5–8.1)

## 2016-03-23 LAB — INFLUENZA PANEL BY PCR (TYPE A & B)
Influenza A By PCR: POSITIVE — AB
Influenza B By PCR: NEGATIVE

## 2016-03-23 LAB — URINALYSIS, COMPLETE (UACMP) WITH MICROSCOPIC
BILIRUBIN URINE: NEGATIVE
Bacteria, UA: NONE SEEN
GLUCOSE, UA: NEGATIVE mg/dL
KETONES UR: 20 mg/dL — AB
LEUKOCYTES UA: NEGATIVE
NITRITE: NEGATIVE
PH: 5 (ref 5.0–8.0)
Protein, ur: 30 mg/dL — AB
Specific Gravity, Urine: 1.019 (ref 1.005–1.030)

## 2016-03-23 LAB — CBC
HEMATOCRIT: 41.5 % (ref 40.0–52.0)
HEMOGLOBIN: 14.2 g/dL (ref 13.0–18.0)
MCH: 30.8 pg (ref 26.0–34.0)
MCHC: 34.1 g/dL (ref 32.0–36.0)
MCV: 90.2 fL (ref 80.0–100.0)
Platelets: 80 10*3/uL — ABNORMAL LOW (ref 150–440)
RBC: 4.61 MIL/uL (ref 4.40–5.90)
RDW: 13.8 % (ref 11.5–14.5)
WBC: 7.5 10*3/uL (ref 3.8–10.6)

## 2016-03-23 LAB — LIPASE, BLOOD: Lipase: 25 U/L (ref 11–51)

## 2016-03-23 NOTE — ED Triage Notes (Signed)
Pt to ed with c/o diarrhea x 3 days.  Pt denies vomiting.

## 2016-03-23 NOTE — ED Provider Notes (Signed)
[facility] Emergency Department Provider Note  ____________________________________________   First MD Initiated Contact with Patient 03/23/16 1320     (approximate)  I have reviewed the triage vital signs and the nursing notes.   HISTORY  Chief Complaint Diarrhea   HPI Derek James is a 83 y.o. male with 2 days of diarrhea as well as cough. He says that he has been having 4-5 episodes of diarrhea which is nonbloody per day. He denies any recent antibiotics or travel outside the country. Denies any abdominal pain. Says that he is also been having a cough which is intermittent and productive of brown sputum. Denies any fever or body aches. Patient also denies any recent hospitalizations.   Past Medical History:  Diagnosis Date  . A-fib ([place])    a. new onset 05/2015; b. CHADS2VASC => 4 (CHF, HTN, age x 2)-->Xarelto initiated-->converted to sinus w/ pac's  . Cancer ([place])    skin ca  . Chronic diastolic CHF (congestive heart failure) ([place])    a. echo 05/2015: Ef 50-55%, rhythm Afib, moderately dilated aortic root, mild AI, moderate MR, LA normal in size, PASP 44 mmHg  . GERD (gastroesophageal reflux disease)   . Hypertension   . Rhinitis     Patient Active Problem List   Diagnosis Date Noted  . Atrial fibrillation ([place]) 06/16/2015  . Chronic diastolic CHF (congestive heart failure) ([place]) 06/16/2015  . Aortic root dilatation ([place]) 06/16/2015  . New onset atrial fibrillation ([place]) 05/31/2015  . Pneumonia due to other specified infectious organisms 05/31/2015  . Acute on chronic diastolic congestive heart failure ([place]) 05/31/2015  . Acute respiratory distress 05/31/2015  . Hyponatremia 05/27/2015  . Weakness generalized 05/27/2015  . HTN, goal below 140/80 05/27/2015  . Tremor 05/27/2015    Past Surgical History:  Procedure Laterality Date  . COLONOSCOPY WITH PROPOFOL N/A 09/05/2014   Procedure: COLONOSCOPY WITH PROPOFOL;  Surgeon: Manya Silvas, MD;  Location: [facility] ENDOSCOPY;  Service: Endoscopy;  Laterality: N/A;  . EYE SURGERY     retinal detatchment  . HERNIA REPAIR    . INNER EAR SURGERY      Prior to Admission medications   Medication Sig Start Date End Date Taking? Authorizing Provider  azelastine (ASTELIN) 0.1 % nasal spray Place 1 spray into both nostrils 2 (two) times daily. Use in each nostril as directed    Historical Provider, MD  benzonatate (TESSALON) 200 MG capsule Take 1 capsule (200 mg total) by mouth 3 (three) times daily as needed for cough. 06/02/15   Srikar Sudini, MD  esomeprazole (NEXIUM) 20 MG capsule Take 20 mg by mouth daily at 12 noon.    Historical Provider, MD  loratadine (CLARITIN) 10 MG tablet Take 10 mg by mouth daily.    Historical Provider, MD  metoprolol tartrate (LOPRESSOR) 25 MG tablet Take 1 tablet (25 mg total) by mouth 2 (two) times daily. 07/06/15   Wende Bushy, MD  mirtazapine (REMERON) 30 MG tablet Take 30 mg by mouth at bedtime.    Historical Provider, MD  Probiotic Product (ALIGN) 4 MG CAPS Take 4 mg by mouth daily. 10/24/14   Historical Provider, MD  rivaroxaban (XARELTO) 20 MG TABS tablet Take 1 tablet (20 mg total) by mouth daily with supper. 06/02/15   Srikar Sudini, MD  tamsulosin (FLOMAX) 0.4 MG CAPS capsule Take 0.4 mg by mouth daily.     Historical Provider, MD  zolpidem (AMBIEN) 10 MG tablet Take 5 mg by mouth  at bedtime as needed for sleep.  05/22/15   Historical Provider, MD    Allergies Dexlansoprazole; Escitalopram; Lansoprazole; and Shrimp [shellfish allergy]  Family History  Problem Relation Age of Onset  . Hypertension Mother   . Hypertension Father   . CAD Father     Social History Social History  Substance Use Topics  . Smoking status: Never Smoker  . Smokeless tobacco: Never Used  . Alcohol use No    Review of Systems Constitutional: No fever/chills Eyes: No visual changes. ENT: No sore throat. Cardiovascular: Denies chest pain. Respiratory:  cough as above Gastrointestinal: No abdominal pain.  No nausea, no vomiting. No constipation. Genitourinary: Negative for dysuria. Musculoskeletal: Negative for back pain. Skin: Negative for rash. Neurological: Negative for headaches, focal weakness or numbness.  10-point ROS otherwise negative.  ____________________________________________   PHYSICAL EXAM:  VITAL SIGNS: ED Triage Vitals  Enc Vitals Group     BP 03/23/16 1113 (!) 127/57     Pulse Rate 03/23/16 1113 71     Resp 03/23/16 1113 20     Temp 03/23/16 1113 99.1 F (37.3 C)     Temp Source 03/23/16 1113 Oral     SpO2 03/23/16 1113 100 %     Weight 03/23/16 1058 145 lb (65.8 kg)     Height --      Head Circumference --      Peak Flow --      Pain Score 03/23/16 1108 0     Pain Loc --      Pain Edu? --      Excl. in [place]? --     Constitutional: Alert and oriented. Well appearing and in no acute distress. Eyes: Conjunctivae are normal. PERRL. EOMI. Head: Atraumatic. Nose: No congestion/rhinnorhea. Mouth/Throat: Mucous membranes are moist.   Neck: No stridor.   Cardiovascular: Normal rate, regular rhythm. Grossly normal heart sounds.  Respiratory: Normal respiratory effort.  No retractions. Lungs CTAB. Gastrointestinal: Soft and nontender. No distention.  Musculoskeletal: No lower extremity tenderness nor edema.  No joint effusions. Neurologic:  Normal speech and language. No gross focal neurologic deficits are appreciated.  Skin:  Skin is warm, dry and intact. No rash noted. Psychiatric: Mood and affect are normal. Speech and behavior are normal.  ____________________________________________   LABS (all labs ordered are listed, but only abnormal results are displayed)  Labs Reviewed  COMPREHENSIVE METABOLIC PANEL - Abnormal; Notable for the following:       Result Value   Sodium 132 (*)    Chloride 97 (*)    Creatinine, Ser 1.28 (*)    Calcium 8.6 (*)    AST 52 (*)    GFR calc non Af Amer 50 (*)     GFR calc Af Amer 58 (*)    All other components within normal limits  CBC - Abnormal; Notable for the following:    Platelets 80 (*)    All other components within normal limits  URINALYSIS, COMPLETE (UACMP) WITH MICROSCOPIC - Abnormal; Notable for the following:    Color, Urine YELLOW (*)    APPearance CLEAR (*)    Hgb urine dipstick MODERATE (*)    Ketones, ur 20 (*)    Protein, ur 30 (*)    Squamous Epithelial / LPF 0-5 (*)    All other components within normal limits  C DIFFICILE QUICK SCREEN W PCR REFLEX  GASTROINTESTINAL PANEL BY PCR, STOOL (REPLACES STOOL CULTURE)  RAPID INFLUENZA A&B ANTIGENS ([facility] ONLY)  LIPASE, BLOOD  ____________________________________________  EKG   ____________________________________________  RADIOLOGY   ____________________________________________   PROCEDURES  Procedure(s) performed:   Procedures  Critical Care performed:   ____________________________________________   INITIAL IMPRESSION / ASSESSMENT AND PLAN / ED COURSE  Pertinent labs & imaging results that were available during my care of the patient were reviewed by me and considered in my medical decision making (see chart for details).  ----------------------------------------- 3:36 PM on 03/23/2016 -----------------------------------------  Patient pending stool studies as well as flu swab at this time. Possible viral illness versus infectious diarrhea. Signed out to Dr. Archie Balboa.      ____________________________________________   FINAL CLINICAL IMPRESSION(S) / ED DIAGNOSES  Diarrhea. Productive cough.    NEW MEDICATIONS STARTED DURING THIS VISIT:  New Prescriptions   No medications on file     Note:  This document was prepared using Dragon voice recognition software and may include unintentional dictation errors.    Orbie Pyo, MD 03/23/16 1537

## 2016-03-23 NOTE — ED Notes (Signed)
Pt attempted to give stool sample at this time but was unsuccessful.

## 2016-03-23 NOTE — ED Triage Notes (Deleted)
Pt to ed with c/o abd pain and diarrhea this am.  Pt states " I think it is appendicitis or gas"

## 2016-03-23 NOTE — ED Notes (Signed)

## 2016-03-23 NOTE — Discharge Instructions (Signed)
Please seek medical attention for any high fevers, chest pain, shortness of breath, change in behavior, persistent vomiting, bloody stool or any other new or concerning symptoms.  

## 2016-03-23 NOTE — ED Provider Notes (Signed)
-----------------------------------------   4:30 PM on 03/23/2016 -----------------------------------------  Patient's influenza was positive. Patient has not been able to produce a stool sample at this point. I did have a discussion with patient. He stated he felt comfortable going home. Symptoms started a few days ago thus I think he is outside of the window to receive Tamiflu. Did discuss with patient importance of following up with primary care physician.   Nance Pear, MD 03/23/16 1630

## 2016-07-10 ENCOUNTER — Other Ambulatory Visit: Payer: Self-pay | Admitting: Cardiology

## 2016-07-10 DIAGNOSIS — I77810 Thoracic aortic ectasia: Secondary | ICD-10-CM

## 2016-07-16 ENCOUNTER — Ambulatory Visit
Admission: RE | Admit: 2016-07-16 | Discharge: 2016-07-16 | Disposition: A | Discharge status: Home / Self Care | Payer: Medicare Other | Source: Ambulatory Visit | Attending: Cardiology | Admitting: Cardiology

## 2016-07-16 DIAGNOSIS — I2510 Atherosclerotic heart disease of native coronary artery without angina pectoris: Secondary | ICD-10-CM | POA: Diagnosis not present

## 2016-07-16 DIAGNOSIS — I700 Atherosclerosis of aorta: Secondary | ICD-10-CM | POA: Diagnosis not present

## 2016-07-16 DIAGNOSIS — J432 Centrilobular emphysema: Secondary | ICD-10-CM | POA: Insufficient documentation

## 2016-07-16 DIAGNOSIS — R918 Other nonspecific abnormal finding of lung field: Secondary | ICD-10-CM | POA: Insufficient documentation

## 2016-07-16 DIAGNOSIS — I77810 Thoracic aortic ectasia: Secondary | ICD-10-CM | POA: Insufficient documentation

## 2016-07-16 DIAGNOSIS — K449 Diaphragmatic hernia without obstruction or gangrene: Secondary | ICD-10-CM | POA: Diagnosis not present

## 2016-07-16 LAB — POCT I-STAT CREATININE: CREATININE: 1.1 mg/dL (ref 0.61–1.24)

## 2016-07-16 MED ORDER — IOPAMIDOL (ISOVUE-370) INJECTION 76%
75.0000 mL | Freq: Once | INTRAVENOUS | Status: AC | PRN
  Administered 2016-07-16: 75 mL via INTRAVENOUS

## 2016-09-27 ENCOUNTER — Encounter: Payer: Self-pay | Admitting: *Deleted

## 2016-09-27 ENCOUNTER — Emergency Department
Admission: EM | Admit: 2016-09-27 | Discharge: 2016-09-27 | Disposition: A | Discharge status: Home / Self Care | Payer: Medicare Other | Attending: Emergency Medicine | Admitting: Emergency Medicine

## 2016-09-27 DIAGNOSIS — R5383 Other fatigue: Secondary | ICD-10-CM | POA: Diagnosis present

## 2016-09-27 DIAGNOSIS — E871 Hypo-osmolality and hyponatremia: Secondary | ICD-10-CM | POA: Insufficient documentation

## 2016-09-27 DIAGNOSIS — M6281 Muscle weakness (generalized): Secondary | ICD-10-CM | POA: Insufficient documentation

## 2016-09-27 DIAGNOSIS — I5041 Acute combined systolic (congestive) and diastolic (congestive) heart failure: Secondary | ICD-10-CM | POA: Insufficient documentation

## 2016-09-27 DIAGNOSIS — R531 Weakness: Secondary | ICD-10-CM

## 2016-09-27 DIAGNOSIS — I110 Hypertensive heart disease with heart failure: Secondary | ICD-10-CM | POA: Insufficient documentation

## 2016-09-27 DIAGNOSIS — Z79899 Other long term (current) drug therapy: Secondary | ICD-10-CM | POA: Insufficient documentation

## 2016-09-27 LAB — CBC
HCT: 43.7 % (ref 40.0–52.0)
HEMOGLOBIN: 14.9 g/dL (ref 13.0–18.0)
MCH: 30.9 pg (ref 26.0–34.0)
MCHC: 34.2 g/dL (ref 32.0–36.0)
MCV: 90.5 fL (ref 80.0–100.0)
PLATELETS: 114 10*3/uL — AB (ref 150–440)
RBC: 4.83 MIL/uL (ref 4.40–5.90)
RDW: 13.8 % (ref 11.5–14.5)
WBC: 5.3 10*3/uL (ref 3.8–10.6)

## 2016-09-27 LAB — URINALYSIS, COMPLETE (UACMP) WITH MICROSCOPIC
Bacteria, UA: NONE SEEN
Bilirubin Urine: NEGATIVE
GLUCOSE, UA: NEGATIVE mg/dL
KETONES UR: NEGATIVE mg/dL
Leukocytes, UA: NEGATIVE
NITRITE: NEGATIVE
PH: 6 (ref 5.0–8.0)
Protein, ur: NEGATIVE mg/dL
Specific Gravity, Urine: 1.011 (ref 1.005–1.030)
Squamous Epithelial / LPF: NONE SEEN

## 2016-09-27 LAB — BASIC METABOLIC PANEL
ANION GAP: 9 (ref 5–15)
BUN: 15 mg/dL (ref 6–20)
CALCIUM: 9.1 mg/dL (ref 8.9–10.3)
CO2: 22 mmol/L (ref 22–32)
CREATININE: 0.85 mg/dL (ref 0.61–1.24)
Chloride: 98 mmol/L — ABNORMAL LOW (ref 101–111)
Glucose, Bld: 133 mg/dL — ABNORMAL HIGH (ref 65–99)
Potassium: 4.2 mmol/L (ref 3.5–5.1)
SODIUM: 129 mmol/L — AB (ref 135–145)

## 2016-09-27 LAB — TROPONIN I: Troponin I: <0.03 ng/mL (ref <0.03)

## 2016-09-27 LAB — HEPATIC FUNCTION PANEL
ALBUMIN: 4.1 g/dL (ref 3.5–5.0)
ALK PHOS: 48 U/L (ref 38–126)
ALT: 13 U/L — ABNORMAL LOW (ref 17–63)
AST: 24 U/L (ref 15–41)
BILIRUBIN DIRECT: 0.2 mg/dL (ref 0.1–0.5)
BILIRUBIN TOTAL: 1 mg/dL (ref 0.3–1.2)
Indirect Bilirubin: 0.8 mg/dL (ref 0.3–0.9)
Total Protein: 6.9 g/dL (ref 6.5–8.1)

## 2016-09-27 MED ORDER — SODIUM CHLORIDE 0.9 % IV BOLUS (SEPSIS)
1000.0000 mL | Freq: Once | INTRAVENOUS | Status: AC
  Administered 2016-09-27: 1000 mL via INTRAVENOUS

## 2016-09-27 NOTE — Discharge Instructions (Signed)
As we discussed please increase yourr salt intake. Please follow-up your doctor next week for recheck of labs including salt level. Your sodium level today is 129. Please drink plenty of fluids. Return to the emergency department for any worsening weakness, or any other symptom personally concerning to yourself.

## 2016-09-27 NOTE — ED Triage Notes (Signed)
Pt states feeling weak for the pat weak, states dizziness and a headache, states decreased PO intake and "not feeling like eating", pt awake and alert upon arrival

## 2016-09-27 NOTE — ED Notes (Signed)
Electronic signature pad not working at this time. Patient verbalizes understanding of discharge teaching and follow up. All questions answered.

## 2016-09-27 NOTE — ED Provider Notes (Signed)
[facility] Emergency Department Provider Note  Time seen: 12:13 PM  I have reviewed the triage vital signs and the nursing notes.   HISTORY  Chief Complaint Dizziness and Weakness    HPI Derek James is a 83 y.o. male with a past medical history of atrial fibrillation, CHF, gastric reflux, hypertension, presents to the emergency department with generalized fatigue and weakness. According to the patient for the past one week he has been feeling very fatigued, weak. States he has not had any appetite. Denies any chest pain, short of breath, abdominal pain, nausea vomiting or diarrhea, dysuria. Negative review of systems otherwise.  Past Medical History:  Diagnosis Date  . A-fib ([place])    a. new onset 05/2015; b. CHADS2VASC => 4 (CHF, HTN, age x 2)-->Xarelto initiated-->converted to sinus w/ pac's  . Cancer ([place])    skin ca  . Chronic diastolic CHF (congestive heart failure) ([place])    a. echo 05/2015: Ef 50-55%, rhythm Afib, moderately dilated aortic root, mild AI, moderate MR, LA normal in size, PASP 44 mmHg  . GERD (gastroesophageal reflux disease)   . Hypertension   . Rhinitis     Patient Active Problem List   Diagnosis Date Noted  . Atrial fibrillation ([place]) 06/16/2015  . Chronic diastolic CHF (congestive heart failure) ([place]) 06/16/2015  . Aortic root dilatation ([place]) 06/16/2015  . New onset atrial fibrillation ([place]) 05/31/2015  . Pneumonia due to other specified infectious organisms 05/31/2015  . Acute on chronic diastolic congestive heart failure ([place]) 05/31/2015  . Acute respiratory distress 05/31/2015  . Hyponatremia 05/27/2015  . Weakness generalized 05/27/2015  . HTN, goal below 140/80 05/27/2015  . Tremor 05/27/2015    Past Surgical History:  Procedure Laterality Date  . COLONOSCOPY WITH PROPOFOL N/A 09/05/2014   Procedure: COLONOSCOPY WITH PROPOFOL;  Surgeon: Manya Silvas, MD;  Location: [facility] ENDOSCOPY;  Service: Endoscopy;   Laterality: N/A;  . EYE SURGERY     retinal detatchment  . HERNIA REPAIR    . INNER EAR SURGERY      Prior to Admission medications   Medication Sig Start Date End Date Taking? Authorizing Provider  azelastine (ASTELIN) 0.1 % nasal spray Place 1 spray into both nostrils 2 (two) times daily. Use in each nostril as directed    [provider]  benzonatate (TESSALON) 200 MG capsule Take 1 capsule (200 mg total) by mouth 3 (three) times daily as needed for cough. 06/02/15   Hillary Bow, MD  esomeprazole (NEXIUM) 20 MG capsule Take 20 mg by mouth daily at 12 noon.    [provider]  loratadine (CLARITIN) 10 MG tablet Take 10 mg by mouth daily.    [provider]  metoprolol tartrate (LOPRESSOR) 25 MG tablet Take 1 tablet (25 mg total) by mouth 2 (two) times daily. 07/06/15   Wende Bushy, MD  mirtazapine (REMERON) 30 MG tablet Take 30 mg by mouth at bedtime.    [provider]  Probiotic Product (ALIGN) 4 MG CAPS Take 4 mg by mouth daily. 10/24/14   [provider]  rivaroxaban (XARELTO) 20 MG TABS tablet Take 1 tablet (20 mg total) by mouth daily with supper. 06/02/15   Hillary Bow, MD  tamsulosin (FLOMAX) 0.4 MG CAPS capsule Take 0.4 mg by mouth daily.     [provider]  zolpidem (AMBIEN) 10 MG tablet Take 5 mg by mouth at bedtime as needed for sleep.  05/22/15   [provider]  Allergies  Allergen Reactions  . Dexlansoprazole Diarrhea  . Escitalopram Diarrhea  . Lansoprazole Diarrhea  . Shrimp [Shellfish Allergy]     Family History  Problem Relation Age of Onset  . Hypertension Mother   . Hypertension Father   . CAD Father     Social History Social History  Substance Use Topics  . Smoking status: Never Smoker  . Smokeless tobacco: Never Used  . Alcohol use No    Review of Systems Constitutional: Negative for fever.Positive for generalized fatigue/weakness. Cardiovascular: Negative for chest  pain. Respiratory: Negative for shortness of breath. Gastrointestinal: Negative for abdominal pain, vomiting and diarrhea. Genitourinary: Negative for dysuria. Neurological: Negative for headache All other ROS negative  ____________________________________________   PHYSICAL EXAM:  VITAL SIGNS: ED Triage Vitals [09/27/16 1108]  Enc Vitals Group     BP (!) 144/63     Pulse Rate 86     Resp 18     Temp 98.6 F (37 C)     Temp Source Oral     SpO2 96 %     Weight 140 lb (63.5 kg)     Height 5\' 4"  (1.626 m)     Head Circumference      Peak Flow      Pain Score 2     Pain Loc      Pain Edu?      Excl. in [place]?     Constitutional: Alert and oriented. Well appearing and in no distress. Eyes: Normal exam ENT   Head: Normocephalic and atraumatic.   Mouth/Throat: Mucous membranes are moist. Cardiovascular: Normal rate, regular rhythm. Respiratory: Normal respiratory effort without tachypnea nor retractions. Breath sounds are clear and equal bilaterally. No wheezes/rales/rhonchi. Gastrointestinal: Soft and nontender. No distention. Musculoskeletal: Nontender with normal range of motion in all extremities. No lower extremity tenderness or edema. Neurologic:  Normal speech and language. No gross focal neurologic deficit. Equal grip strengths. No pronator drift. 5/5 motor in all extremities. Skin:  Skin is warm, dry and intact.  Psychiatric: Mood and affect are normal.   ____________________________________________    EKG  EKG reviewed and interpreted by myself appears to show a normal sinus rhythm at 84 bpm, slightly widened QRS, left axis deviation, nonspecific ST changes without elevation.  ____________________________________________   INITIAL IMPRESSION / ASSESSMENT AND PLAN / ED COURSE  Pertinent labs & imaging results that were available during my care of the patient were reviewed by me and considered in my medical decision making (see chart for details).  The  patient presents to the emergency department with symptoms of generalized fatigue/weakness. Patient states he has not been eating or drinking as much recently. Patient denies any specific complaints. States he is feeling dizzy, but goes on to describe the dizziness as more of a feeling of fatigue and weakness. Denies any spinning sensation. Denies lightheadedness or feeling like he is going to pass out. Patient has an overall normal exam. Patient alert oriented and, normal neurological exam, converses normally. No signs of CVA clinically. We will check labs and closely monitor in the emergency department. Patient agreeable to plan.  ----------------------------------------- 2:00 PM on 09/27/2016 -----------------------------------------  Patient continues to appear very well. Patient's lab works have resulted showing mild hyponatremia. We will dose 1 L of normal saline. I discussed with the patient increasing his salt intake and following up with his doctor this week for recheck of lab work. Patient agreeable to plan.  ____________________________________________   FINAL CLINICAL IMPRESSION(S) / ED DIAGNOSES  Generalized fatigue/weakness Hyponatremia   Harvest Dark, MD 09/27/16 1401

## 2016-10-17 ENCOUNTER — Other Ambulatory Visit: Payer: Self-pay | Admitting: Cardiology

## 2016-10-17 DIAGNOSIS — R42 Dizziness and giddiness: Secondary | ICD-10-CM

## 2016-10-23 ENCOUNTER — Ambulatory Visit
Admission: RE | Admit: 2016-10-23 | Discharge: 2016-10-23 | Disposition: A | Discharge status: Home / Self Care | Payer: Medicare Other | Source: Ambulatory Visit | Attending: Cardiology | Admitting: Cardiology

## 2016-10-23 DIAGNOSIS — G311 Senile degeneration of brain, not elsewhere classified: Secondary | ICD-10-CM | POA: Diagnosis not present

## 2016-10-23 DIAGNOSIS — R9082 White matter disease, unspecified: Secondary | ICD-10-CM | POA: Diagnosis not present

## 2016-10-23 DIAGNOSIS — R42 Dizziness and giddiness: Secondary | ICD-10-CM

## 2016-10-24 DIAGNOSIS — R42 Dizziness and giddiness: Secondary | ICD-10-CM | POA: Insufficient documentation

## 2016-12-23 ENCOUNTER — Encounter: Payer: Self-pay | Admitting: Urology

## 2016-12-23 ENCOUNTER — Ambulatory Visit (INDEPENDENT_AMBULATORY_CARE_PROVIDER_SITE_OTHER): Payer: Medicare Other | Admitting: Urology

## 2016-12-23 VITALS — BP 129/69 | HR 96 | Ht 64.0 in | Wt 138.1 lb

## 2016-12-23 DIAGNOSIS — R350 Frequency of micturition: Secondary | ICD-10-CM

## 2016-12-23 LAB — URINALYSIS, COMPLETE
BILIRUBIN UA: NEGATIVE
Glucose, UA: NEGATIVE
Ketones, UA: NEGATIVE
Leukocytes, UA: NEGATIVE
NITRITE UA: NEGATIVE
PH UA: 6 (ref 5.0–7.5)
PROTEIN UA: NEGATIVE
SPEC GRAV UA: 1.01 (ref 1.005–1.030)
UUROB: 0.2 mg/dL (ref 0.2–1.0)

## 2016-12-23 LAB — BLADDER SCAN AMB NON-IMAGING: SCAN RESULT: 120

## 2016-12-23 MED ORDER — MIRABEGRON ER 50 MG PO TB24: 50.0000 mg | ORAL_TABLET | Freq: Every day | ORAL | 11 refills | Status: DC

## 2016-12-23 NOTE — Progress Notes (Signed)
12/23/2016 3:21 PM   Derek James 01/28/1931 [id number]  Referring provider: Maryland Pink, MD [address] [facility] [place], [place] [address]  Chief Complaint  Patient presents with  . Urinary Frequency    HPI: I was consulted to assess the patient's daytime and nighttime frequency.  After 3 AM he can void every 1 or 2 hours but also does similar during the day.  His flow is reasonable and he does feel empty.  He has had a stroke.  He is on Flomax   He does not take Lasix or steroids.  Bowel movements normal.  No history of previous GU surgery or urinary tract infections.  Modifying factors: There are no other modifying factors  Associated signs and symptoms: There are no other associated signs and symptoms Aggravating and relieving factors: There are no other aggravating or relieving factors Severity: Moderate Duration: Persistent   PMH: Past Medical History:  Diagnosis Date  . A-fib ([place])    a. new onset 05/2015; b. CHADS2VASC => 4 (CHF, HTN, age x 2)-->Xarelto initiated-->converted to sinus w/ pac's  . Cancer ([place])    skin ca  . Chronic diastolic CHF (congestive heart failure) ([place])    a. echo 05/2015: Ef 50-55%, rhythm Afib, moderately dilated aortic root, mild AI, moderate MR, LA normal in size, PASP 44 mmHg  . GERD (gastroesophageal reflux disease)   . Hypertension   . Rhinitis     Surgical History: Past Surgical History:  Procedure Laterality Date  . EYE SURGERY     retinal detatchment  . HERNIA REPAIR    . INNER EAR SURGERY      Home Medications:  Allergies as of 12/23/2016      Reactions   Dexlansoprazole Diarrhea   Escitalopram Diarrhea   Lansoprazole Diarrhea   Shrimp [shellfish Allergy]       Medication List        Accurate as of 12/23/16  3:21 PM. Always use your most recent med list.          ALIGN 4 MG Caps Take 4 mg by mouth daily.   azelastine 0.1 % nasal spray Commonly known as:  ASTELIN Place 1 spray into  both nostrils 2 (two) times daily. Use in each nostril as directed   benzonatate 200 MG capsule Commonly known as:  TESSALON Take 1 capsule (200 mg total) by mouth 3 (three) times daily as needed for cough.   esomeprazole 20 MG capsule Commonly known as:  NEXIUM Take 20 mg by mouth at bedtime.   loratadine 10 MG tablet Commonly known as:  CLARITIN Take 10 mg by mouth daily.   metoprolol tartrate 25 MG tablet Commonly known as:  LOPRESSOR Take 1 tablet (25 mg total) by mouth 2 (two) times daily.   rivaroxaban 20 MG Tabs tablet Commonly known as:  XARELTO Take 1 tablet (20 mg total) by mouth daily with supper.   tamsulosin 0.4 MG Caps capsule Commonly known as:  FLOMAX Take 0.4 mg by mouth daily.   VITAMIN B 12 PO Take by mouth.   Vitamin D3 3000 units Tabs Take by mouth.   zolpidem 10 MG tablet Commonly known as:  AMBIEN Take 5 mg by mouth at bedtime as needed for sleep.       Allergies:  Allergies  Allergen Reactions  . Dexlansoprazole Diarrhea  . Escitalopram Diarrhea  . Lansoprazole Diarrhea  . Shrimp [Shellfish Allergy]     Family History: Family History  Problem Relation  Age of Onset  . Hypertension Mother   . Hypertension Father   . CAD Father   . Bladder Cancer Neg Hx   . Kidney cancer Neg Hx   . Prostate cancer Neg Hx     Social History:  reports that  has never smoked. he has never used smokeless tobacco. He reports that he does not drink alcohol or use drugs.  ROS: UROLOGY Frequent Urination?: Yes Hard to postpone urination?: Yes Burning/pain with urination?: No Get up at night to urinate?: Yes Leakage of urine?: No Urine stream starts and stops?: No Trouble starting stream?: No Do you have to strain to urinate?: No Blood in urine?: No Urinary tract infection?: No Sexually transmitted disease?: No Injury to kidneys or bladder?: No Painful intercourse?: No Weak stream?: No Erection problems?: No Penile pain?:  No  Gastrointestinal Nausea?: Yes Vomiting?: No Indigestion/heartburn?: No Diarrhea?: Yes Constipation?: No  Constitutional Fever: No Night sweats?: No Weight loss?: Yes Fatigue?: Yes  Skin Skin rash/lesions?: No Itching?: No  Eyes Blurred vision?: Yes Double vision?: No  Ears/Nose/Throat Sore throat?: No Sinus problems?: No  Hematologic/Lymphatic Swollen glands?: No Easy bruising?: Yes  Cardiovascular Leg swelling?: No Chest pain?: No  Respiratory Cough?: Yes Shortness of breath?: No  Endocrine Excessive thirst?: No  Musculoskeletal Back pain?: Yes Joint pain?: No  Neurological Headaches?: No Dizziness?: Yes  Psychologic Depression?: Yes Anxiety?: No  Physical Exam: BP 129/69 (BP Location: Left Arm, Patient Position: Sitting, Cuff Size: Normal)   Pulse 96   Ht 5\' 4"  (1.626 m)   Wt 138 lb 1.6 oz (62.6 kg)   BMI 23.70 kg/m   Constitutional:  Alert and oriented, No acute distress. HEENT: [place] AT, moist mucus membranes.  Trachea midline, no masses. Cardiovascular: No clubbing, cyanosis, or edema. Respiratory: Normal respiratory effort, no increased work of breathing. GI: Abdomen is soft, nontender, nondistended, no abdominal masses GU: No CVA tenderness.  50 g benign prostate Skin: No rashes, bruises or suspicious lesions. Lymph: No cervical or inguinal adenopathy. Neurologic: Grossly intact, no focal deficits, moving all 4 extremities. Psychiatric: Normal mood and affect.  Laboratory Data: Lab Results  Component Value Date   WBC 5.3 09/27/2016   HGB 14.9 09/27/2016   HCT 43.7 09/27/2016   MCV 90.5 09/27/2016   PLT 114 (L) 09/27/2016    Lab Results  Component Value Date   CREATININE 0.85 09/27/2016    No results found for: PSA  No results found for: TESTOSTERONE  No results found for: HGBA1C  Urinalysis    Component Value Date/Time   COLORURINE YELLOW (A) 09/27/2016 1116   APPEARANCEUR CLEAR (A) 09/27/2016 1116   LABSPEC  1.011 09/27/2016 1116   PHURINE 6.0 09/27/2016 1116   GLUCOSEU NEGATIVE 09/27/2016 1116   HGBUR SMALL (A) 09/27/2016 1116   [place] 09/27/2016 1116   KETONESUR NEGATIVE 09/27/2016 1116   PROTEINUR NEGATIVE 09/27/2016 1116   NITRITE NEGATIVE 09/27/2016 1116   LEUKOCYTESUR NEGATIVE 09/27/2016 1116    Pertinent Imaging: None  Assessment & Plan: The patient has daytime and nighttime frequency.  Urinalysis was negative.  The role of the beta 3 agonist was discussed.  He is not a good candidate for desmopressin because he has had a serum sodium of 129 2 months ago and his creatinine clearance is 50 mils per minute  His residual was 120 mL and this may represent a false positive.   The patient notes when he drinks less his symptoms are quite a bit better.  I  gave him Myrbetriq 50 mg samples and prescription and I will see him in 5 weeks.  If he decides to choose watchful waiting he will call and cancel the appointment.  He is concerned about more medications and the potential for side effects.  Antimuscarinics and percutaneous tibial nerve stimulation are still options.  Overall I think reassurance was helpful today  1. Urinary frequency 2.  Nighttime frequency  - Urinalysis, Complete - Bladder Scan (Post Void Residual) in office   No Follow-up on file.  Reece Packer, MD  [facility] [address] [place], [place] [address] [phone]

## 2017-01-20 ENCOUNTER — Ambulatory Visit: Payer: Medicare Other

## 2017-01-27 ENCOUNTER — Ambulatory Visit (INDEPENDENT_AMBULATORY_CARE_PROVIDER_SITE_OTHER): Payer: Medicare Other | Admitting: Urology

## 2017-01-27 ENCOUNTER — Encounter: Payer: Self-pay | Admitting: Urology

## 2017-01-27 VITALS — BP 158/74 | HR 106 | Ht 64.0 in | Wt 141.0 lb

## 2017-01-27 DIAGNOSIS — R350 Frequency of micturition: Secondary | ICD-10-CM | POA: Diagnosis not present

## 2017-01-27 DIAGNOSIS — M545 Low back pain, unspecified: Secondary | ICD-10-CM

## 2017-01-27 DIAGNOSIS — R102 Pelvic and perineal pain: Secondary | ICD-10-CM | POA: Diagnosis not present

## 2017-01-27 LAB — URINALYSIS, COMPLETE
BILIRUBIN UA: NEGATIVE
Glucose, UA: NEGATIVE
KETONES UA: NEGATIVE
Leukocytes, UA: NEGATIVE
Nitrite, UA: NEGATIVE
PH UA: 5.5 (ref 5.0–7.5)
PROTEIN UA: NEGATIVE
SPEC GRAV UA: 1.015 (ref 1.005–1.030)
UUROB: 0.2 mg/dL (ref 0.2–1.0)

## 2017-01-27 LAB — MICROSCOPIC EXAMINATION
Bacteria, UA: NONE SEEN
EPITHELIAL CELLS (NON RENAL): NONE SEEN /HPF (ref 0–10)
WBC, UA: NONE SEEN /hpf (ref 0–?)

## 2017-01-27 LAB — BLADDER SCAN AMB NON-IMAGING: SCAN RESULT: 143

## 2017-01-27 NOTE — Progress Notes (Signed)
01/27/2017 2:23 PM   Derek James November 05, 1930 [id number]  Referring provider: Maryland Pink, MD [address] [facility] [place], [place] [address]  Chief Complaint  Patient presents with  . Urinary Tract Infection    HPI: Patient returns today with complaints of low back pain and SP pain. He also has groin pain. This started off and on about a week ago and was more bothersome today. His UA is normal. He's had no cloudy urine or gross hematuria. No dysuria. He has a remote history of stones. He's not been active and has PT pending.   He was seen last month by Dr. Matilde Sprang for daytime and nighttime frequency.  His flow was reasonable and he was on tamsulosin.  He has had a stroke. His PVR was 120 ml and possibly a false positive. His DRE was normal. Pt given Myrbetriq 50 mg samples, but didn't take it -- "too many side effects".    PMH: Past Medical History:  Diagnosis Date  . A-fib ([place])    a. new onset 05/2015; b. CHADS2VASC => 4 (CHF, HTN, age x 2)-->Xarelto initiated-->converted to sinus w/ pac's  . Cancer ([place])    skin ca  . Chronic diastolic CHF (congestive heart failure) ([place])    a. echo 05/2015: Ef 50-55%, rhythm Afib, moderately dilated aortic root, mild AI, moderate MR, LA normal in size, PASP 44 mmHg  . GERD (gastroesophageal reflux disease)   . Hypertension   . Rhinitis     Surgical History: Past Surgical History:  Procedure Laterality Date  . COLONOSCOPY WITH PROPOFOL N/A 09/05/2014   Procedure: COLONOSCOPY WITH PROPOFOL;  Surgeon: Manya Silvas, MD;  Location: [facility] ENDOSCOPY;  Service: Endoscopy;  Laterality: N/A;  . EYE SURGERY     retinal detatchment  . HERNIA REPAIR    . INNER EAR SURGERY      Home Medications:  Allergies as of 01/27/2017      Reactions   Dexlansoprazole Diarrhea   Escitalopram Diarrhea   Lansoprazole Diarrhea   Shrimp [shellfish Allergy]       Medication List        Accurate as of 01/27/17  2:23 PM. Always  use your most recent med list.          ALIGN 4 MG Caps Take 4 mg by mouth daily.   azelastine 0.1 % nasal spray Commonly known as:  ASTELIN Place 1 spray into both nostrils 2 (two) times daily. Use in each nostril as directed   benzonatate 200 MG capsule Commonly known as:  TESSALON Take 1 capsule (200 mg total) by mouth 3 (three) times daily as needed for cough.   esomeprazole 20 MG capsule Commonly known as:  NEXIUM Take 20 mg by mouth at bedtime.   loratadine 10 MG tablet Commonly known as:  CLARITIN Take 10 mg by mouth daily.   metoprolol tartrate 25 MG tablet Commonly known as:  LOPRESSOR Take 1 tablet (25 mg total) by mouth 2 (two) times daily.   mirabegron ER 50 MG Tb24 tablet Commonly known as:  MYRBETRIQ Take 1 tablet (50 mg total) daily by mouth.   rivaroxaban 20 MG Tabs tablet Commonly known as:  XARELTO Take 1 tablet (20 mg total) by mouth daily with supper.   tamsulosin 0.4 MG Caps capsule Commonly known as:  FLOMAX Take 0.4 mg by mouth daily.   VITAMIN B 12 PO Take by mouth.   Vitamin D3 3000 units Tabs Take by mouth.  zolpidem 10 MG tablet Commonly known as:  AMBIEN Take 5 mg by mouth at bedtime as needed for sleep.       Allergies:  Allergies  Allergen Reactions  . Dexlansoprazole Diarrhea  . Escitalopram Diarrhea  . Lansoprazole Diarrhea  . Shrimp [Shellfish Allergy]     Family History: Family History  Problem Relation Age of Onset  . Hypertension Mother   . Hypertension Father   . CAD Father   . Bladder Cancer Neg Hx   . Kidney cancer Neg Hx   . Prostate cancer Neg Hx     Social History:  reports that  has never smoked. he has never used smokeless tobacco. He reports that he does not drink alcohol or use drugs.  ROS:                                        Physical Exam: There were no vitals taken for this visit.  Constitutional:  Alert and oriented, No acute distress. HEENT: [place] AT, moist mucus  membranes.  Trachea midline, no masses. Cardiovascular: No clubbing, cyanosis, or edema. Respiratory: Normal respiratory effort, no increased work of breathing. GI: Abdomen is soft, nontender, nondistended, no abdominal masses GU: No CVA tenderness. Skin: No rashes, bruises or suspicious lesions. Neurologic: Grossly intact, no focal deficits, moving all 4 extremities. Psychiatric: Normal mood and affect.  Laboratory Data: Lab Results  Component Value Date   WBC 5.3 09/27/2016   HGB 14.9 09/27/2016   HCT 43.7 09/27/2016   MCV 90.5 09/27/2016   PLT 114 (L) 09/27/2016    Lab Results  Component Value Date   CREATININE 0.85 09/27/2016    No results found for: PSA1  No results found for: TESTOSTERONE  No results found for: HGBA1C  Urinalysis Lab Results  Component Value Date   SPECGRAV 1.010 12/23/2016   PHUR 6.0 12/23/2016   COLORU Yellow 12/23/2016   APPEARANCEUR Clear 12/23/2016   LEUKOCYTESUR Negative 12/23/2016   PROTEINUR Negative 12/23/2016   GLUCOSEU Negative 12/23/2016   KETONESU Negative 12/23/2016   RBCU Trace (A) 12/23/2016   BILIRUBINUR Negative 12/23/2016   UUROB 0.2 12/23/2016   NITRITE Negative 12/23/2016    Lab Results  Component Value Date   BACTERIA NONE SEEN 09/27/2016   No results found for this or any previous visit. No results found for this or any previous visit. No results found for this or any previous visit. No results found for this or any previous visit. No results found for this or any previous visit. No results found for this or any previous visit. No results found for this or any previous visit. No results found for this or any previous visit.  Assessment & Plan:   1. Frequency, low back pain and SP discomfort - screen kidneys with renal US. I encouraged him to start PT. Recent DRE normal.  - Urinalysis, Complete  2. bph with Frequency - discussed nature r/b of increasing alpha blocker, 5ARI, anticholinerics and myrbetriq --  he doesn't want to take meds and will continue surveillance.    No Follow-up on file.  Festus Aloe, MD  [facility] [address] [place], [place] [address] [phone]

## 2017-02-17 ENCOUNTER — Ambulatory Visit: Payer: Medicare Other

## 2017-02-18 ENCOUNTER — Ambulatory Visit
Admission: RE | Admit: 2017-02-18 | Discharge: 2017-02-18 | Disposition: A | Discharge status: Home / Self Care | Payer: Medicare Other | Source: Ambulatory Visit | Attending: Urology | Admitting: Urology

## 2017-02-18 DIAGNOSIS — M545 Low back pain, unspecified: Secondary | ICD-10-CM

## 2017-02-18 DIAGNOSIS — R102 Pelvic and perineal pain: Secondary | ICD-10-CM | POA: Diagnosis present

## 2017-02-18 DIAGNOSIS — R350 Frequency of micturition: Secondary | ICD-10-CM

## 2017-02-21 ENCOUNTER — Ambulatory Visit: Payer: Medicare Other | Attending: Neurology | Admitting: Physical Therapy

## 2017-02-21 ENCOUNTER — Encounter: Payer: Self-pay | Admitting: Physical Therapy

## 2017-02-21 ENCOUNTER — Other Ambulatory Visit: Payer: Self-pay

## 2017-02-21 DIAGNOSIS — R42 Dizziness and giddiness: Secondary | ICD-10-CM | POA: Insufficient documentation

## 2017-02-21 NOTE — Therapy (Signed)
[place] MAIN [facility] SERVICES [address] [place], [place], [address] Phone: [phone]   Fax:  [phone]  Physical Therapy Evaluation  Patient Details  Name: Derek James MRN: [id number] Date of Birth: 14-Jun-1930 Referring Provider: Dr. Joselyn Arrow   Encounter Date: 02/21/2017  PT End of Session - 02/24/17 1324    Visit Number  1    Number of Visits  9    Date for PT Re-Evaluation  04/18/17    PT Start Time  1100    PT Stop Time  1220    PT Time Calculation (min)  80 min    Equipment Utilized During Treatment  Gait belt    Activity Tolerance  Patient tolerated treatment well    Behavior During Therapy  [facility] for tasks assessed/performed       Past Medical History:  Diagnosis Date  . A-fib ([place])    a. new onset 05/2015; b. CHADS2VASC => 4 (CHF, HTN, age x 2)-->Xarelto initiated-->converted to sinus w/ pac's  . Cancer ([place])    skin ca  . Chronic diastolic CHF (congestive heart failure) ([place])    a. echo 05/2015: Ef 50-55%, rhythm Afib, moderately dilated aortic root, mild AI, moderate MR, LA normal in size, PASP 44 mmHg  . GERD (gastroesophageal reflux disease)   . Hypertension   . Rhinitis     Past Surgical History:  Procedure Laterality Date  . COLONOSCOPY WITH PROPOFOL N/A 09/05/2014   Procedure: COLONOSCOPY WITH PROPOFOL;  Surgeon: Manya Silvas, MD;  Location: [facility] ENDOSCOPY;  Service: Endoscopy;  Laterality: N/A;  . EYE SURGERY     retinal detatchment  . HERNIA REPAIR    . INNER EAR SURGERY      There were no vitals filed for this visit.   Subjective Assessment - 02/24/17 1323    Subjective  Patient states that he would like to be able to resume going back to the gym. Patient feels like he has lost strength and his balance is off.     Pertinent History  Patient reports he was going to the gym 4-6 times a week in 2017. Patient states he had been going to the gym regularly from [phone]. Patient had pneumonia from  mold in his house and as also diagnosed with A-fib in 2017 per patient report. Patient reports he had lots of side effects from new medications and was loosing weight during 2017 causing him to stop going to the gym. Patient states he tried to return to the gym. Patient reports he has not been to the gym now in 4 or 5 months because he states "I just got so weak". Patient states that it is a goal of his to be able to return to the gym. Patient reports that he lost 18 pounds initially and states he has talked with his physicians about this issue. Patient states he went to see Dr. Brigitte Pulse neurologist and urologist. Patient reports that he has had a balance problem since 1982 because he had 3 ear surgeries that that he felt directly impacted his balance. 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 Patient reports that after his ear surgeries he felt it impacted his balance but states he did not experience dizziness. Patient states that since he had "the sick spell in 2017", he started to get more dizziness and imbalance as compared to  his basline. Patient reports that he gets temporary dizziness depending upon what he does. Patient reports that he walks and looks to the side he can become imbalanced which he states is nothing new. Patient reports that he is not able to balance if he is trying to walk in the dark, but denies dizziness with this activity. Patient reports that he had hearing tests at ENT office ,but he is not sure if he had any formal vestibular testing. Described VNG test and this was not familiar to patient. Patient describes his dizziness as temporarily his vision does not focus as well with blurriness at times. Patient reports that he had surgery for a detached retina on 08/1997. Patient reports that he began to have double vision and had surgery for this condition 04/1998 and 10/1998. Patient  states that this helped but did not resolve completely his symptoms of diplopia. Per patient's self-reported history, he suffered a mini stroke via MRI on 10/22/2006.     Diagnostic tests  MRI brain 10/23/2016 revealed per MR: remote lacunar infarct involving left thalamus and external capsule, atrophy and white matter disease.     Currently in Pain?  No/denies           [facility] PT Assessment - 02/24/17 1331      Assessment   Medical Diagnosis  imbalance/ vestibular    Referring Provider  Dr. Joselyn Arrow    Onset Date/Surgical Date  -- reports worsening balance and dizziness since 2017    Prior Therapy  no prior vestibular therapy      Precautions   Precautions  Fall      Restrictions   Weight Bearing Restrictions  No      Balance Screen   Has the patient fallen in the past 6 months  Yes    How many times?  1    Has the patient had a decrease in activity level because of a fear of falling?   Yes    Is the patient reluctant to leave their home because of a fear of falling?   No      Home Social worker  Private residence    Living Arrangements  Alone    Available Help at Discharge  Family;Friend(s)    Type of [place] to enter    Entrance Stairs-Number of Steps  2    Entrance Stairs-Rails  [place]  One level    [place] bars - tub/shower;Toilet riser;Walker - 2 wheels      Prior Function   Level of Independence  Independent with community mobility without device    Vocation  Retired    Leisure  patient likes to go to the gym      Cognition   Overall Cognitive Status  Within Functional Limits for tasks assessed      Standardized Balance Assessment   Standardized Balance Assessment  Dynamic Gait Index      Dynamic Gait Index   Level Surface  Mild Impairment    Change in Gait Speed  Mild Impairment    Gait with Horizontal Head Turns  Mild Impairment    Gait with Vertical Head Turns  Mild Impairment     Gait and Pivot Turn  Normal    Step Over Obstacle  Mild Impairment    Step Around Obstacles  Normal    Steps  Moderate Impairment    Total Score  3           VESTIBULAR AND BALANCE EVALUATION   HISTORY:  Subjective history of current problem: Patient reports he was going to the gym 4-6 times a week in 2017. Patient states he had been going to the gym regularly from [phone]. Patient had pneumonia from mold in his house and as also diagnosed with A-fib in 2017 per patient report. Patient reports he had lots of side effects from new medications and was loosing weight during 2017 causing him to stop going to the gym. Patient states he tried to return to the gym. Patient reports he has not been to the gym now in 4 or 5 months because he states "I just got so weak". Patient states that it is a goal of his to be able to return to the gym. Patient reports that he lost 18 pounds initially and states he has talked with his physicians about this issue. Patient states he went to see Dr. Brigitte Pulse neurologist and urologist. Patient reports that he has had a balance problem since 1982 because he had 3 ear surgeries that that he felt directly impacted his balance. 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 Patient reports that after his ear surgeries he felt it impacted his balance but states he did not experience dizziness. Patient states that since he had "the sick spell in 2017", he started to get more dizziness and imbalance as compared to his basline. Patient reports that he gets temporary dizziness depending upon what he does. Patient reports that he walks and looks to the side he can become imbalanced which he states is nothing new. Patient reports that he is not able to balance if he is trying to walk in the dark, but denies dizziness with this activity. Patient reports that he had hearing tests at  ENT office ,but he is not sure if he had any formal vestibular testing. Described VNG test and this was not familiar to patient. Patient describes his dizziness as temporarily his vision does not focus as well with blurriness at times. Patient reports that he had surgery for a detached retina on 08/1997. Patient reports that he began to have double vision and had surgery for this condition 04/1998 and 10/1998. Patient states that this helped but did not resolve completely his symptoms of diplopia. Per patient's self-reported history, he suffered a mini stroke via MRI on 10/22/2006.    Description of dizziness:  unsteadiness, lightheadedness, falling, general unsteadiness, aural fullness) temporarily vision does not focus as well with blurriness at times Frequency: reports that it largely depends on what he is doing and states it has not been much recently. Reports 2-3 times a month. Duration: "temporary" minutes states it stops when he gets settled Symptom nature: motion provoked, variable, intermittent   Provocative Factors: walking with head turns, bending over, standing and moving around, movement usually is involved Easing Factors: stop doing what was causing the dizziness  Progression of symptoms: reports in April of 2017 symptoms became worse but states now they have gotten better   Falls (yes/no): yes Number of falls in past 6 months: patient reports that he got his feet tangled up when he first got out of bed about 5 months ago. Patient states this was the only fall he has had in the past 1-2 years.   Auditory complaints (tinnitus, pain, drainage): reports intermittent  tinnitus in ears- unsure which ear states this has been going on for years. History of repeat ear infections. Patient reports he goes every 3 months with ENT physician and patient states that now his ear infections have significantly improved and he is not been experiencing ear infections and drainage.   Vision (last eye exam,  diplopia, recent changes): wore hard contact lens until 2017; patient tried soft contact lenses and these did not work for him. Patient wears glasses currently and patient states that his glasses have never seemed right and that he has returned to the eye doctor numerous times. Patient reports that he is needing cataract surgery bilaterally but states he has been putting it off. Patient reports that at times if he does not get his eyes focused just right he will get double vision and patient states that his eye doctor told him that he would probably have this always. Patient reports he has had two eye surgeries to try to correct double vision already. Patient reports that he has an appt on 03/11/17 with his glaucoma doctor and yearly sees a doctor in regards to his retina.   Current Symptoms: (dysarthria, dysphagia, drop attacks, bowel and bladder changes, recent weight loss/gain)    Review of systems negative for red flags.     EXAMINATION   SOMATOSENSORY:  Any N & T in extremities or weakness: denies      COORDINATION: Finger to Nose:   Normal Past Pointing:   Normal  MUSCULOSKELETAL SCREEN: Cervical Spine ROM: WFL AROM cervical spine flexion and extension and grossly 45 degrees left rotation and 55-60 degrees right rotation  ROM: B LEs AROM WNL Right shoulder flexion grossly 0-150 degrees Left shoulder flexion grossly 0-160 degrees  MMT:   Grossly R UE: elbow flexion -5/5, triceps 4/5, shoulder flexion and ABDuction +4/5 Grossly L UE: elbow flexion 5/5, triceps 5/5, shoulder flexion and ABDuction +4/5  Grossly R LE: Hip flexors -5/5, hip ABD -5/5, quads 5/5, hams -5/5 and DF 5/5 Grossly L LE: Hip flexors -5/5, hip ABD 4/5, quads 5/5, hams -5/5 and DF -5/5  Gait: patient arrives to clinic ambulating without AD. Patient ambulates with decreased cadence and step length and height with decreased arm swing and fair scanning of the visual environment.   Balance: Patient challenged by  uneven surfaces, eyes closed, narrow BOS, ambulation with head turns and SLS activities.   POSTURAL CONTROL TESTS:  Clinical Test of Sensory Interaction for Balance    (CTSIB):  CONDITION TIME STRATEGY SWAY  Eyes open, firm surface 30 seconds ankle +1  Eyes closed, firm surface 30 seconds ankle +2  Eyes open, foam surface 18 seconds Ankle, hips +4  Eyes closed, foam surface 4 seconds Ankle, hips, reaching +4    OCULOMOTOR / VESTIBULAR TESTING:  Oculomotor Exam- Room Light  Normal Abnormal Comments  Ocular Alignment N    Ocular ROM N    Spontaneous Nystagmus N    End-Gaze Nystagmus  ABN More pronounced with right lateral gaze but present left and right   Smooth Pursuit  ABN Saccadic eye movements throughout  Saccades  ABN Left field small corrective saccades noted to obtain target  VOR  ABN Patient reports blurring of target and mild dizziness  VOR Cancellation N    Left Head Thrust  ABN Appeared to have corrective saccades bilaterally but difficult to assess secondary to patient has difficulty relaxing for test and startle reaction  Right Head Thrust  ABN Appeared to have corrective saccades bilaterally  but difficult to assess secondary to patient has difficulty relaxing for test and startle reaction  Head Shaking Nystagmus N  Patient denies dizziness; performed at lower speed despite cuing    FUNCTIONAL OUTCOME MEASURES:  Results Comments  DHI 42 Moderate perception of handicap; in need of intervention  ABC Scale 74.4% Falls risk; in need of intervention  DGI 17/24 Falls risk; in need of intervention    Neuromuscular Re-education: VOR X 1 exercise:  Demonstrated and educated as to VOR X1.  Patient performed VOR X 1 horizontal in sitting one rep of 30 seconds and 2 reps of 1 minute each with verbal cues for technique initially as patient started to rock/tip head laterally instead of moving his head side to side and for speed and excursion of movement.   Patient reports the  target is blurring at times and reports 0/10 dizziness after 30 second trial and 3-4/10 dizziness after 1 minute reps.  Issued for HEP. Provided handout. Plan on reviewing next visit.     PT Education - 02/24/17 1324    Education provided  Yes    Education Details  discussed plan of care; issued VOR X 1 for HEP    Person(s) Educated  Patient    Methods  Explanation;Demonstration;Verbal cues;Handout    Comprehension  Verbalized understanding;Returned demonstration       PT Short Term Goals - 02/24/17 1329      PT SHORT TERM GOAL #1   Title  Patient will be able to perform home program independently for self-management.    Time  4    Period  Weeks    Status  New    Target Date  03/21/17        PT Long Term Goals - 02/24/17 1328      PT LONG TERM GOAL #1   Title  Patient will demonstrate reduced falls risk as evidenced by Dynamic Gait Index (DGI) >19/24.    Baseline  17/24 on 02/21/17    Time  8    Period  Weeks    Status  New    Target Date  04/18/17      PT LONG TERM GOAL #2   Title  Patient will reduce falls risk as indicated by Activities Specific Balance Confidence Scale (ABC) >67%.    Baseline   74.4% on 02/21/17    Time  8    Period  Weeks    Status  New    Target Date  04/18/17      PT LONG TERM GOAL #3   Title  Patient will reduce perceived disability to low levels as indicated by <40 on Dizziness Handicap Inventory to demonstrate significant improvement in dizziness.    Baseline  42/100 on 02/21/17    Time  8    Period  Weeks    Status  New    Target Date  04/18/17      PT LONG TERM GOAL #4   Title  Patient will be able to return to going to the gym weekly to resume his prior activity level.     Time  8    Period  Weeks    Status  New    Target Date  04/18/17      PT LONG TERM GOAL #5   Title  Patient will report that he has been able to resume dining out and visiting with his family and friends as these are activities that he enjoyed prior to onset of  his  symptoms.     Time  8    Period  Weeks    Status  New    Target Date  04/18/17             Plan - 02/24/17 1326    Clinical Impression Statement  Patient reports that he has increased imbalance and dizziness since 2017. Patient with evidence of potential central as well as peripheral signs with vestibular testing. Patient noted to have saccadic smoot pursuits, end gaze nystagmus more pronounced with right field gaze and hypometric saccades. In addition, patient with potentially positive head thrust testing. Patient reports blurrying of targets at times but reports history of diplopia since surgery for retinal detachment in 1999. Patient with listed functional deficits and is at risk for falls. Patient would benefit from PT services to address goals as set on plan of care, functional deficits and to try to reduce patient's falls risk and subjective symptoms.     History and Personal Factors relevant to plan of care:  comorbidities-history of multiple eye and ear surgeries, age    Clinical Presentation  Evolving    Clinical Decision Making  Moderate    Rehab Potential  Good    Clinical Impairments Affecting Rehab Potential  positive indicators: motivated, family support  negative indicators: potential central and peripheral signs, chronicity, history of diplopia    PT Frequency  1x / week    PT Duration  8 weeks    PT Treatment/Interventions  [place] Repostioning;Gait training;Stair training;Functional mobility training;Therapeutic activities;Patient/family education;Neuromuscular re-education;Balance training;Therapeutic exercise;Vestibular    PT Next Visit Plan  review VOR    PT Home Exercise Plan  VOR x 1 in sitting 1 minute reps    Consulted and Agree with Plan of Care  Patient       Patient will benefit from skilled therapeutic intervention in order to improve the following deficits and impairments:  Decreased balance, Dizziness, Decreased mobility, Decreased strength,  Difficulty walking  Visit Diagnosis: Dizziness and giddiness     Problem List Patient Active Problem List   Diagnosis Date Noted  . Atrial fibrillation ([place]) 06/16/2015  . Chronic diastolic CHF (congestive heart failure) ([place]) 06/16/2015  . Aortic root dilatation ([place]) 06/16/2015  . New onset atrial fibrillation ([place]) 05/31/2015  . Pneumonia due to other specified infectious organisms 05/31/2015  . Acute on chronic diastolic congestive heart failure ([place]) 05/31/2015  . Acute respiratory distress 05/31/2015  . Hyponatremia 05/27/2015  . Weakness generalized 05/27/2015  . HTN, goal below 140/80 05/27/2015  . Tremor 05/27/2015   Lady Deutscher PT, DPT [phone] Lady Deutscher 02/24/2017, 2:24 PM  [place] MAIN [facility] SERVICES [address] [place], [place], [address] Phone: [phone]   Fax:  [phone]  Name: Derek James MRN: [id number] Date of Birth: 04-28-1930

## 2017-02-24 ENCOUNTER — Telehealth: Payer: Self-pay

## 2017-02-24 ENCOUNTER — Other Ambulatory Visit: Payer: Self-pay

## 2017-02-24 NOTE — Telephone Encounter (Signed)
Patient notified

## 2017-02-24 NOTE — Telephone Encounter (Signed)
-----   Message from Festus Aloe, MD sent at 02/21/2017  5:52 PM EST ----- Notify patient -- his renal US looked normal. No worrisome findings or blockage of the kidneys.   ----- Message ----- From: Royanne Foots, CMA Sent: 02/20/2017   2:12 PM To: Festus Aloe, MD    ----- Message ----- From: Interface, Rad Results In Sent: 02/19/2017   8:36 AM To: Rowe Robert Clinical

## 2017-02-28 ENCOUNTER — Encounter: Payer: Self-pay | Admitting: Physical Therapy

## 2017-02-28 ENCOUNTER — Ambulatory Visit: Payer: Medicare Other | Admitting: Physical Therapy

## 2017-02-28 DIAGNOSIS — R42 Dizziness and giddiness: Secondary | ICD-10-CM

## 2017-02-28 NOTE — Therapy (Signed)
[place] MAIN [facility] SERVICES [address] [place], [place], [address] Phone: [phone]   Fax:  [phone]  Physical Therapy Treatment  Patient Details  Name: Derek James MRN: [id number] Date of Birth: 1930/10/15 Referring Provider: Dr. Joselyn Arrow   Encounter Date: 02/28/2017  PT End of Session - 02/28/17 1035    Visit Number  2    Number of Visits  9    Date for PT Re-Evaluation  04/18/17    PT Start Time  1030    PT Stop Time  1115    PT Time Calculation (min)  45 min    Equipment Utilized During Treatment  Gait belt    Activity Tolerance  Patient tolerated treatment well    Behavior During Therapy  [facility] for tasks assessed/performed       Past Medical History:  Diagnosis Date  . A-fib ([place])    a. new onset 05/2015; b. CHADS2VASC => 4 (CHF, HTN, age x 2)-->Xarelto initiated-->converted to sinus w/ pac's  . Cancer ([place])    skin ca  . Chronic diastolic CHF (congestive heart failure) ([place])    a. echo 05/2015: Ef 50-55%, rhythm Afib, moderately dilated aortic root, mild AI, moderate MR, LA normal in size, PASP 44 mmHg  . GERD (gastroesophageal reflux disease)   . Hypertension   . Rhinitis     Past Surgical History:  Procedure Laterality Date  . COLONOSCOPY WITH PROPOFOL N/A 09/05/2014   Procedure: COLONOSCOPY WITH PROPOFOL;  Surgeon: Manya Silvas, MD;  Location: [facility] ENDOSCOPY;  Service: Endoscopy;  Laterality: N/A;  . EYE SURGERY     retinal detatchment  . HERNIA REPAIR    . INNER EAR SURGERY      There were no vitals filed for this visit.  Subjective Assessment - 02/28/17 1033    Subjective  Patient is doing well today, no new complaints. He had an [place] for a UTI and it was negative. He did his therapy exercise every day.    Pertinent History  Patient reports he was going to the gym 4-6 times a week in 2017. Patient states he had been going to the gym regularly from [phone]. Patient had pneumonia from mold in his house  and as also diagnosed with A-fib in 2017 per patient report. Patient reports he had lots of side effects from new medications and was loosing weight during 2017 causing him to stop going to the gym. Patient states he tried to return to the gym. Patient reports he has not been to the gym now in 4 or 5 months because he states "I just got so weak". Patient states that it is a goal of his to be able to return to the gym. Patient reports that he lost 18 pounds initially and states he has talked with his physicians about this issue. Patient states he went to see Dr. Brigitte Pulse neurologist and urologist. Patient reports that he has had a balance problem since 1982 because he had 3 ear surgeries that that he felt directly impacted his balance. 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 Patient reports that after his ear surgeries he felt it impacted his balance but states he did not experience dizziness. Patient states that since he had "the sick spell in 2017", he started to get more dizziness and imbalance as compared to his basline. Patient reports  that he gets temporary dizziness depending upon what he does. Patient reports that he walks and looks to the side he can become imbalanced which he states is nothing new. Patient reports that he is not able to balance if he is trying to walk in the dark, but denies dizziness with this activity. Patient reports that he had hearing tests at ENT office ,but he is not sure if he had any formal vestibular testing. Described VNG test and this was not familiar to patient. Patient describes his dizziness as temporarily his vision does not focus as well with blurriness at times. Patient reports that he had surgery for a detached retina on 08/1997. Patient reports that he began to have double vision and had surgery for this condition 04/1998 and 10/1998. Patient states that this  helped but did not resolve completely his symptoms of diplopia. Per patient's self-reported history, he suffered a mini stroke via MRI on 10/22/2006.     Diagnostic tests  MRI brain 10/23/2016 revealed per MR: remote lacunar infarct involving left thalamus and external capsule, atrophy and white matter disease.     Currently in Pain?  No/denies       NEUROMUSCULAR RE-EDUCATION  Airex NBOS eyes open x 30 seconds, x 3   Airex NBOS eyes open horizontal and vertical head turns x 30 seconds; x 3  Floor feet NBOS eyes closed x 30 sec x 3 with feet changing from NBOS to modified tandem   Floor feet together w/ head turns, modified tandem and head turns x 30 sec x 4 trials  Toe tapping to 6 inch stool without UE assist, from airex x 20  Step ups from blue foam to 6 inch stool x 10 with min assist and loss of balance , needs UE 50% of the time  Rocker board, fwd / bwd, and side to side,  UE support to encourage weight shift; patient has difficulty with fwd weight shift   of the parallel bars      Therapeutic exercise  Quantum double leg press  120# x 20 x 2 , 75 lbs x 20 x 2 heel raises   Matrix resisted gait retro x 5 each direction all 12.5# , loss of balance x 1 and slow short steps backwards     CGA and  mod verbal cues used throughout with increased in postural sway and LOB most seen with narrow base of support and while on uneven surfaces. Continues to have balance deficits typical with diagnosis. Patient performs intermediate level exercises without pain behaviors and needs verbal cuing for postural alignment and head positioning                    PT Education - 02/28/17 1034    Education provided  Yes    Education Details  HEP    Person(s) Educated  Patient    Methods  Explanation;Demonstration    Comprehension  Verbalized understanding;Returned demonstration       PT Short Term Goals - 02/24/17 1329      PT SHORT TERM GOAL #1   Title  Patient will be  able to perform home program independently for self-management.    Time  4    Period  Weeks    Status  New    Target Date  03/21/17        PT Long Term Goals - 02/24/17 1328      PT LONG TERM GOAL #1   Title  Patient will demonstrate  reduced falls risk as evidenced by Dynamic Gait Index (DGI) >19/24.    Baseline  17/24 on 02/21/17    Time  8    Period  Weeks    Status  New    Target Date  04/18/17      PT LONG TERM GOAL #2   Title  Patient will reduce falls risk as indicated by Activities Specific Balance Confidence Scale (ABC) >67%.    Baseline   74.4% on 02/21/17    Time  8    Period  Weeks    Status  New    Target Date  04/18/17      PT LONG TERM GOAL #3   Title  Patient will reduce perceived disability to low levels as indicated by <40 on Dizziness Handicap Inventory to demonstrate significant improvement in dizziness.    Baseline  42/100 on 02/21/17    Time  8    Period  Weeks    Status  New    Target Date  04/18/17      PT LONG TERM GOAL #4   Title  Patient will be able to return to going to the gym weekly to resume his prior activity level.     Time  8    Period  Weeks    Status  New    Target Date  04/18/17      PT LONG TERM GOAL #5   Title  Patient will report that he has been able to resume dining out and visiting with his family and friends as these are activities that he enjoyed prior to onset of his symptoms.     Time  8    Period  Weeks    Status  New    Target Date  04/18/17            Plan - 02/28/17 1036    Clinical Impression Statement  Patient demonstrates decreased dynamic standing balance and requires verbal and tactile cueing to maintain center of gravity during dynamic balance activities. Patient also requires CGA during all dynamic standing balance activities falls. He was able to perform beginning and intermediate strengthening in closed and open chain without pain behaviors.  Patient requires consistent cueing to maintain correct  position during balance activities.  Patient demonstrates difficulty with dynamic standing balance and increased postural sway while on purple foam. Patient will continue to benefit from skilled therapy in order to improve dynamic standing balance and increase endurance    Rehab Potential  Good    Clinical Impairments Affecting Rehab Potential  positive indicators: motivated, family support  negative indicators: potential central and peripheral signs, chronicity, history of diplopia    PT Frequency  1x / week    PT Duration  8 weeks    PT Treatment/Interventions  [place]alith Repostioning;Gait training;Stair training;Functional mobility training;Therapeutic activities;Patient/family education;Neuromuscular re-education;Balance training;Therapeutic exercise;Vestibular    PT Next Visit Plan  review VOR    PT Home Exercise Plan  VOR x 1 in sitting 1 minute reps    Consulted and Agree with Plan of Care  Patient       Patient will benefit from skilled therapeutic intervention in order to improve the following deficits and impairments:  Decreased balance, Dizziness, Decreased mobility, Decreased strength, Difficulty walking  Visit Diagnosis: Dizziness and giddiness     Problem List Patient Active Problem List   Diagnosis Date Noted  . Atrial fibrillation ([place]) 06/16/2015  . Chronic diastolic CHF (congestive heart failure) ([place]) 06/16/2015  .  Aortic root dilatation ([place]) 06/16/2015  . New onset atrial fibrillation ([place]) 05/31/2015  . Pneumonia due to other specified infectious organisms 05/31/2015  . Acute on chronic diastolic congestive heart failure ([place]) 05/31/2015  . Acute respiratory distress 05/31/2015  . Hyponatremia 05/27/2015  . Weakness generalized 05/27/2015  . HTN, goal below 140/80 05/27/2015  . Tremor 05/27/2015    Alanson Puls, PT DPT 02/28/2017, 10:43 AM  [place] MAIN [facility] SERVICES [address] [place], [place],  [address] Phone: [phone]   Fax:  [phone]  Name: Derek James MRN: [id number] Date of Birth: 1930/12/17

## 2017-03-06 ENCOUNTER — Encounter: Payer: Self-pay | Admitting: Urology

## 2017-03-06 ENCOUNTER — Ambulatory Visit (INDEPENDENT_AMBULATORY_CARE_PROVIDER_SITE_OTHER): Payer: Medicare Other | Admitting: Urology

## 2017-03-06 VITALS — BP 146/56 | HR 114 | Ht 64.0 in | Wt 141.0 lb

## 2017-03-06 DIAGNOSIS — N200 Calculus of kidney: Secondary | ICD-10-CM | POA: Diagnosis not present

## 2017-03-06 NOTE — Progress Notes (Signed)
03/06/2017 2:41 PM   Derek James 1931/01/15 [id number]  Referring provider: [place] Pink, MD [address] [facility] [place], [place] [address]  Chief Complaint  Patient presents with  . Results    HPI: 84 year old male presents for follow-up of a renal ultrasound.  He saw Dr. Matilde Sprang in November 2018 and Dr. Junious Silk in December 2018 for lower urinary tract symptoms.  At his last visit with Dr. Junious Silk he was complaining of back pain and a renal ultrasound was ordered.  He states his voiding symptoms are stable and not bothersome.    His renal ultrasound does show bilateral renal cysts and a 6 mm right renal calculus.  Prostate volume was calculated at 76 cc and PVR was 73 mL.   PMH: Past Medical History:  Diagnosis Date  . A-fib ([place])    a. new onset 05/2015; b. CHADS2VASC => 4 (CHF, HTN, age x 2)-->Xarelto initiated-->converted to sinus w/ pac's  . Cancer ([place])    skin ca  . Chronic diastolic CHF (congestive heart failure) ([place])    a. echo 05/2015: Ef 50-55%, rhythm Afib, moderately dilated aortic root, mild AI, moderate MR, LA normal in size, PASP 44 mmHg  . GERD (gastroesophageal reflux disease)   . Hypertension   . Rhinitis     Surgical History: Past Surgical History:  Procedure Laterality Date  . COLONOSCOPY WITH PROPOFOL N/A 09/05/2014   Procedure: COLONOSCOPY WITH PROPOFOL;  Surgeon: Manya Silvas, MD;  Location: [facility] ENDOSCOPY;  Service: Endoscopy;  Laterality: N/A;  . EYE SURGERY     retinal detatchment  . HERNIA REPAIR    . INNER EAR SURGERY      Home Medications:  Allergies as of 03/06/2017      Reactions   Dexlansoprazole Diarrhea   Escitalopram Diarrhea   Lansoprazole Diarrhea   Shrimp [shellfish Allergy]       Medication List        Accurate as of 03/06/17  2:41 PM. Always use your most recent med list.          ALIGN 4 MG Caps Take 4 mg by mouth daily.   azelastine 0.1 % nasal spray Commonly known as:   ASTELIN Place 1 spray into both nostrils 2 (two) times daily. Use in each nostril as directed   esomeprazole 20 MG capsule Commonly known as:  NEXIUM Take 20 mg by mouth at bedtime.   loratadine 10 MG tablet Commonly known as:  CLARITIN Take 10 mg by mouth daily.   rivaroxaban 20 MG Tabs tablet Commonly known as:  XARELTO Take 1 tablet (20 mg total) by mouth daily with supper.   tamsulosin 0.4 MG Caps capsule Commonly known as:  FLOMAX Take 0.4 mg by mouth daily.   VITAMIN B 12 PO Take by mouth.   Vitamin D3 3000 units Tabs Take by mouth.   zolpidem 10 MG tablet Commonly known as:  AMBIEN Take 5 mg by mouth at bedtime as needed for sleep.       Allergies:  Allergies  Allergen Reactions  . Dexlansoprazole Diarrhea  . Escitalopram Diarrhea  . Lansoprazole Diarrhea  . Shrimp [Shellfish Allergy]     Family History: Family History  Problem Relation Age of Onset  . Hypertension Mother   . Hypertension Father   . CAD Father   . Bladder Cancer Neg Hx   . Kidney cancer Neg Hx   . Prostate cancer Neg Hx     Social History:  reports  that  has never smoked. he has never used smokeless tobacco. He reports that he does not drink alcohol or use drugs.  ROS: UROLOGY Frequent Urination?: Yes Hard to postpone urination?: Yes Burning/pain with urination?: No Get up at night to urinate?: Yes Leakage of urine?: No Urine stream starts and stops?: No Trouble starting stream?: No Do you have to strain to urinate?: No Blood in urine?: No Urinary tract infection?: No Sexually transmitted disease?: No Injury to kidneys or bladder?: No Painful intercourse?: No Weak stream?: No Erection problems?: No Penile pain?: No  Gastrointestinal Nausea?: No Vomiting?: No Indigestion/heartburn?: No Diarrhea?: No Constipation?: No  Constitutional Fever: No Night sweats?: No Weight loss?: No Fatigue?: No  Skin Skin rash/lesions?: No Itching?: No  Eyes Blurred vision?:  No Double vision?: No  Ears/Nose/Throat Sore throat?: No Sinus problems?: No  Hematologic/Lymphatic Swollen glands?: No Easy bruising?: Yes  Cardiovascular Leg swelling?: No Chest pain?: No  Respiratory Cough?: No Shortness of breath?: No  Endocrine Excessive thirst?: No  Musculoskeletal Back pain?: Yes Joint pain?: No  Neurological Headaches?: No Dizziness?: No  Psychologic Depression?: No Anxiety?: No  Physical Exam: BP (!) 146/56   Pulse (!) 114   Ht 5\' 4"  (1.626 m)   Wt 141 lb (64 kg)   BMI 24.20 kg/m   Constitutional:  Alert and oriented, No acute distress. HEENT: [place] AT, moist mucus membranes.  Trachea midline,   Laboratory Data: Lab Results  Component Value Date   WBC 5.3 09/27/2016   HGB 14.9 09/27/2016   HCT 43.7 09/27/2016   MCV 90.5 09/27/2016   PLT 114 (L) 09/27/2016    Lab Results  Component Value Date   CREATININE 0.85 09/27/2016     Pertinent Imaging:  Results for orders placed during the hospital encounter of 02/18/17  US RENAL   Narrative CLINICAL DATA:  Acute low back pain for the past month associated with suprapubic pain and urinary frequency. History of kidney stones.  EXAM: RENAL / URINARY TRACT ULTRASOUND COMPLETE  COMPARISON:  CT scan of the abdomen and pelvis of February 12, 2007.  FINDINGS: Right Kidney:  Length: 9.4 cm. The renal cortical echotexture remains lower than that of the adjacent liver. Anechoic structures in the parapelvic region are most compatible with parapelvic cysts and are similar in appearance to those seen on the prior CT scan. No definite hydronephrosis. There is a 6 mm stone in the mid upper pole.  Left Kidney:  Length: 10.0 cm. The renal cortical echotexture is similar to that on the right. There are upper and lower pole cortical cysts present. The upper pole cyst measures 1.9 cm in diameter in the lower pole cyst measures 2.5 cm in diameter. Other smaller parapelvic cysts  are visible.  Bladder:  Bilateral ureteral jets are observed. The prostate gland measures 4.4 x 3.2 x 4.7 cm. The prevoid volume was 76.8 cc and the postvoid volume still 73.3 cc.  IMPRESSION: Cortical and parapelvic cysts bilaterally. No definite hydronephrosis.  Moderate postvoid residual urinary bladder volume of 73 cc. Bilateral ureteral jets are observed.   Electronically Signed   By: David  Martinique M.D.   On: 02/19/2017 08:34     Assessment & Plan:    1.  Bilateral simple renal cysts He was reassured that these are common.  2. Nephrolithiasis Nonobstructing right renal calculus.  Recommend a follow-up KUB in 6 months.  He was instructed to call earlier for development of right flank or abdominal pain.  3.  BPH  with lower urinary tract symptoms Stable and not bothersome.  Return in about 6 months (around 09/03/2017) for Recheck, KUB.  [name], [place] [address] [place], [place] [address] [phone]

## 2017-03-07 ENCOUNTER — Encounter: Payer: Self-pay | Admitting: Physical Therapy

## 2017-03-07 ENCOUNTER — Ambulatory Visit: Payer: Medicare Other | Admitting: Physical Therapy

## 2017-03-07 DIAGNOSIS — R42 Dizziness and giddiness: Secondary | ICD-10-CM | POA: Diagnosis not present

## 2017-03-07 NOTE — Therapy (Signed)
[place] MAIN [facility] SERVICES [address] [place], [place], [address] Phone: [phone]   Fax:  [phone]  Physical Therapy Treatment  Patient Details  Name: Derek James MRN: [id number] Date of Birth: 06/25/1930 Referring Provider: Dr. Joselyn Arrow   Encounter Date: 03/07/2017  PT End of Session - 03/07/17 1100    Visit Number  3    Number of Visits  9    Date for PT Re-Evaluation  04/18/17    PT Start Time  1100    PT Stop Time  1146    PT Time Calculation (min)  46 min    Equipment Utilized During Treatment  Gait belt    Activity Tolerance  Patient tolerated treatment well    Behavior During Therapy  [facility] for tasks assessed/performed       Past Medical History:  Diagnosis Date  . A-fib ([place])    a. new onset 05/2015; b. CHADS2VASC => 4 (CHF, HTN, age x 2)-->Xarelto initiated-->converted to sinus w/ pac's  . Cancer ([place])    skin ca  . Chronic diastolic CHF (congestive heart failure) ([place])    a. echo 05/2015: Ef 50-55%, rhythm Afib, moderately dilated aortic root, mild AI, moderate MR, LA normal in size, PASP 44 mmHg  . GERD (gastroesophageal reflux disease)   . Hypertension   . Rhinitis     Past Surgical History:  Procedure Laterality Date  . COLONOSCOPY WITH PROPOFOL N/A 09/05/2014   Procedure: COLONOSCOPY WITH PROPOFOL;  Surgeon: Manya Silvas, MD;  Location: [facility] ENDOSCOPY;  Service: Endoscopy;  Laterality: N/A;  . EYE SURGERY     retinal detatchment  . HERNIA REPAIR    . INNER EAR SURGERY      There were no vitals filed for this visit.  Subjective Assessment - 03/07/17 1100    Subjective  Patient states that he progressed himself to standing VOR and states he did okay with that. Patient states he tried feet together exercise, but states he was not too successful.     Pertinent History  Patient reports he was going to the gym 4-6 times a week in 2017. Patient states he had been going to the gym regularly from  [phone]. Patient had pneumonia from mold in his house and as also diagnosed with A-fib in 2017 per patient report. Patient reports he had lots of side effects from new medications and was loosing weight during 2017 causing him to stop going to the gym. Patient states he tried to return to the gym. Patient reports he has not been to the gym now in 4 or 5 months because he states "I just got so weak". Patient states that it is a goal of his to be able to return to the gym. Patient reports that he lost 18 pounds initially and states he has talked with his physicians about this issue. Patient states he went to see Dr. Brigitte Pulse neurologist and urologist. Patient reports that he has had a balance problem since 1982 because he had 3 ear surgeries that that he felt directly impacted his balance. 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 Patient reports that after his ear surgeries he felt it impacted his balance but states he did not experience dizziness. Patient states that since he had "the sick spell in 2017", he started to get more dizziness and imbalance as compared  to his basline. Patient reports that he gets temporary dizziness depending upon what he does. Patient reports that he walks and looks to the side he can become imbalanced which he states is nothing new. Patient reports that he is not able to balance if he is trying to walk in the dark, but denies dizziness with this activity. Patient reports that he had hearing tests at ENT office ,but he is not sure if he had any formal vestibular testing. Described VNG test and this was not familiar to patient. Patient describes his dizziness as temporarily his vision does not focus as well with blurriness at times. Patient reports that he had surgery for a detached retina on 08/1997. Patient reports that he began to have double vision and had surgery for this  condition 04/1998 and 10/1998. Patient states that this helped but did not resolve completely his symptoms of diplopia. Per patient's self-reported history, he suffered a mini stroke via MRI on 10/22/2006.     Diagnostic tests  MRI brain 10/23/2016 revealed per MR: remote lacunar infarct involving left thalamus and external capsule, atrophy and white matter disease.     Currently in Pain?  No/denies       Therapeutic Exercise:  Sit to Stands:  Patient educated as to proper form with sit to stand. Patient performed 10 reps sit to stand from armless chair with verbal cuing for sequencing and to control eccentric descent.  Patient reports leg fatigue but was able to complete 10 reps without using UEs support.   Slow Marching: Patient performed on firm surface slow marching with eyes open about 15 reps with CGA. Patient unable to perform without one hand support on // bar. Patient with practice was able to decrease amount of support being used by one hand but still needed several fingers touch for balance.  Patient improved with practice.  Patient denies dizziness with this activity.    Neuromuscular Re-education:  VOR X 1 exercise:  Patient performed VOR X 1 horizontal in standing with conflicting background 3 reps of 1 minute each with verbal cues for technique.  Patient denies dizziness with this activity.   Feet together exercise: On firm surface, patient performed feet together progressions (progressed to feet together) with and without body turns and horizontal and vertical head turns.  Performed multiple 1 minute reps of each.   Semi-Tandem Progressions exercise:  On firm surface, patient performed semi-tandem progressions with alternating lead leg with and without body turns and horizontal and vertical head turns.  Performed multiple 1 minute reps of each.     PT Education - 03/07/17 1100    Education provided  Yes    Education Details  issued slides 1,3,4,5,7,8, & 13 from  vestibular powerpoint for HEP-VOR with conflicting background progression    Person(s) Educated  Patient    Methods  Explanation;Demonstration;Handout;Verbal cues    Comprehension  Verbalized understanding;Returned demonstration       PT Short Term Goals - 02/24/17 1329      PT SHORT TERM GOAL #1   Title  Patient will be able to perform home program independently for self-management.    Time  4    Period  Weeks    Status  New    Target Date  03/21/17        PT Long Term Goals - 02/24/17 1328      PT LONG TERM GOAL #1   Title  Patient will demonstrate reduced falls risk as evidenced by Dynamic Gait Index (  DGI) >19/24.    Baseline  17/24 on 02/21/17    Time  8    Period  Weeks    Status  New    Target Date  04/18/17      PT LONG TERM GOAL #2   Title  Patient will reduce falls risk as indicated by Activities Specific Balance Confidence Scale (ABC) >67%.    Baseline   74.4% on 02/21/17    Time  8    Period  Weeks    Status  New    Target Date  04/18/17      PT LONG TERM GOAL #3   Title  Patient will reduce perceived disability to low levels as indicated by <40 on Dizziness Handicap Inventory to demonstrate significant improvement in dizziness.    Baseline  42/100 on 02/21/17    Time  8    Period  Weeks    Status  New    Target Date  04/18/17      PT LONG TERM GOAL #4   Title  Patient will be able to return to going to the gym weekly to resume his prior activity level.     Time  8    Period  Weeks    Status  New    Target Date  04/18/17      PT LONG TERM GOAL #5   Title  Patient will report that he has been able to resume dining out and visiting with his family and friends as these are activities that he enjoyed prior to onset of his symptoms.     Time  8    Period  Weeks    Status  New    Target Date  04/18/17            Plan - 03/07/17 1101    Clinical Impression Statement  Patient challenged by narrow base of support, single leg stance and activites in  uneven surfaces this date. Patient requires CGA. Patient initially requiring significant one hand support but was able to progress to no UEs support or mild use of few fingers for balance. Patient able to perform 10 reps sit to stand demonstrating good eccentric descent However, patient reports that this was fatiguing for his leg muscles. Patient issued additional strengthening and balance exercises for HEP. Patient eager to be able to return to going to the gym and improving his strength. Patient would benefit from continued PT services to further addres functional deficits and goals as set on plan of care.     Rehab Potential  Good    Clinical Impairments Affecting Rehab Potential  positive indicators: motivated, family support  negative indicators: potential central and peripheral signs, chronicity, history of diplopia    PT Frequency  1x / week    PT Duration  8 weeks    PT Treatment/Interventions  Canalith Repostioning;Gait training;Stair training;Functional mobility training;Therapeutic activities;Patient/family education;Neuromuscular re-education;Balance training;Therapeutic exercise;Vestibular    PT Next Visit Plan  review HEP, leg press, biodex tower, nustep, balance progressions- airex pad, cone tapping, alternate foot tapping; in future sessions, consider taking patient over to [facility] to do strengthening exercises on machines in prep for him returning to the gym    PT Home Exercise Plan  VOR x 1 in standing with conflicting background 1 minute reps, feet together and semi-tandem progressions with and without vertical and horizontal head turns and body turns, slow marching, sit to stand    Consulted and Agree with Plan of Care  Patient       Patient will benefit from skilled therapeutic intervention in order to improve the following deficits and impairments:  Decreased balance, Dizziness, Decreased mobility, Decreased strength, Difficulty walking  Visit Diagnosis: Dizziness and  giddiness     Problem List Patient Active Problem List   Diagnosis Date Noted  . Atrial fibrillation ([place]) 06/16/2015  . Chronic diastolic CHF (congestive heart failure) ([place]) 06/16/2015  . Aortic root dilatation ([place]) 06/16/2015  . New onset atrial fibrillation ([place]) 05/31/2015  . Pneumonia due to other specified infectious organisms 05/31/2015  . Acute on chronic diastolic congestive heart failure ([place]) 05/31/2015  . Acute respiratory distress 05/31/2015  . Hyponatremia 05/27/2015  . Weakness generalized 05/27/2015  . HTN, goal below 140/80 05/27/2015  . Tremor 05/27/2015    Karmyn Lowman 03/07/2017, 12:53 PM  [place] MAIN [facility] SERVICES [address] [place], [place], [address] Phone: [phone]   Fax:  [phone]  Name: Derek James MRN: [id number] Date of Birth: 05/21/1930

## 2017-03-09 ENCOUNTER — Encounter: Payer: Self-pay | Admitting: Urology

## 2017-03-09 DIAGNOSIS — N200 Calculus of kidney: Secondary | ICD-10-CM | POA: Insufficient documentation

## 2017-03-14 ENCOUNTER — Ambulatory Visit: Payer: Medicare Other | Admitting: Physical Therapy

## 2017-03-21 ENCOUNTER — Ambulatory Visit: Payer: Medicare Other | Admitting: Physical Therapy

## 2017-03-28 ENCOUNTER — Encounter: Payer: Self-pay | Admitting: Physical Therapy

## 2017-03-28 ENCOUNTER — Ambulatory Visit: Payer: Medicare Other | Attending: Neurology | Admitting: Physical Therapy

## 2017-03-28 VITALS — BP 124/61 | HR 87

## 2017-03-28 DIAGNOSIS — R42 Dizziness and giddiness: Secondary | ICD-10-CM

## 2017-03-28 NOTE — Therapy (Addendum)
[place] MAIN [facility] SERVICES [address] [place], [place], [address] Phone: [phone]   Fax:  [phone]  Physical Therapy Treatment  Patient Details  Name: CAVON NICOLLS MRN: [id number] Date of Birth: 1930/04/21 Referring Provider: Dr. Joselyn Arrow   Encounter Date: 03/28/2017  PT End of Session - 03/28/17 1331    Visit Number  4    Number of Visits  9    Date for PT Re-Evaluation  04/18/17    PT Start Time  1120    PT Stop Time  1204    PT Time Calculation (min)  44 min    Equipment Utilized During Treatment  Gait belt    Activity Tolerance  Patient tolerated treatment well    Behavior During Therapy  [facility] for tasks assessed/performed       Past Medical History:  Diagnosis Date  . A-fib ([place])    a. new onset 05/2015; b. CHADS2VASC => 4 (CHF, HTN, age x 2)-->Xarelto initiated-->converted to sinus w/ pac's  . Cancer ([place])    skin ca  . Chronic diastolic CHF (congestive heart failure) ([place])    a. echo 05/2015: Ef 50-55%, rhythm Afib, moderately dilated aortic root, mild AI, moderate MR, LA normal in size, PASP 44 mmHg  . GERD (gastroesophageal reflux disease)   . Hypertension   . Rhinitis     Past Surgical History:  Procedure Laterality Date  . COLONOSCOPY WITH PROPOFOL N/A 09/05/2014   Procedure: COLONOSCOPY WITH PROPOFOL;  Surgeon: Manya Silvas, MD;  Location: [facility] ENDOSCOPY;  Service: Endoscopy;  Laterality: N/A;  . EYE SURGERY     retinal detatchment  . HERNIA REPAIR    . INNER EAR SURGERY      Vitals:   03/28/17 1120 03/28/17 1140 03/28/17 1150  BP: (!) 142/63 (!) 153/65 124/61  Pulse: 87 98   SpO2: 99%      Subjective Assessment - 03/28/17 1123    Subjective  Patient states that he has been doing his home exercise program every day but states he has to break it up into 2 or 3 parts to be able to complete everything secondary to fatigue and weakness. Patient states he feels his "blood might be off" and states he  goes to get his blood drawn Monday and seeing a provider. Patient reports his sodium has been off in the past.     Pertinent History  Patient reports he was going to the gym 4-6 times a week in 2017. Patient states he had been going to the gym regularly from [phone]. Patient had pneumonia from mold in his house and as also diagnosed with A-fib in 2017 per patient report. Patient reports he had lots of side effects from new medications and was loosing weight during 2017 causing him to stop going to the gym. Patient states he tried to return to the gym. Patient reports he has not been to the gym now in 4 or 5 months because he states "I just got so weak". Patient states that it is a goal of his to be able to return to the gym. Patient reports that he lost 18 pounds initially and states he has talked with his physicians about this issue. Patient states he went to see Dr. Brigitte Pulse neurologist and urologist. Patient reports that he has had a balance problem since 1982 because he had 3 ear surgeries that that he felt directly impacted his balance. 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 Patient reports that after his ear surgeries he felt it impacted his balance but states he did not experience dizziness. Patient states that since he had "the sick spell in 2017", he started to get more dizziness and imbalance as compared to his basline. Patient reports that he gets temporary dizziness depending upon what he does. Patient reports that he walks and looks to the side he can become imbalanced which he states is nothing new. Patient reports that he is not able to balance if he is trying to walk in the dark, but denies dizziness with this activity. Patient reports that he had hearing tests at ENT office ,but he is not sure if he had any formal vestibular testing. Described VNG test and this was not familiar to  patient. Patient describes his dizziness as temporarily his vision does not focus as well with blurriness at times. Patient reports that he had surgery for a detached retina on 08/1997. Patient reports that he began to have double vision and had surgery for this condition 04/1998 and 10/1998. Patient states that this helped but did not resolve completely his symptoms of diplopia. Per patient's self-reported history, he suffered a mini stroke via MRI on 10/22/2006.     Diagnostic tests  MRI brain 10/23/2016 revealed per MR: remote lacunar infarct involving left thalamus and external capsule, atrophy and white matter disease.     Currently in Pain?  No/denies      Therapeutic Exercise:  Nustep: Patient performed Nu-step machine on level 5 for 6 minutes with cuing for pace. Patient's vital signs monitored. Patient reports fatigue in his arms with activity.  Sit to Stands:  Patient performed 15 reps sit to stand from armless chair with Airex pad placed under feet. Patient demonstrating good technique and good eccentric control with descent to chair with S. Patient reports leg fatigue during last repetitions.   Neuromuscular Re-education:  Cone tapping: On firm surface, patient performed foot tapping to cones in series of one, two and then three targets as called out by therapist. Patient occasionally touching // bar for support due to losses of balance. In standing on Airex pad, patient performed foot tapping to cones in series of one and two cones as called out by therapist with CGA. Patient with multiple small losses of balance where he needed to reach // bars for support.  Patient progressed from one hand support to no UEs support with practice. Patient requiring assistance ranging from CGA to Mod A at times due to loss of balance.    Step Ups: Patient performed step ups on 6" wooden box 10 reps without upper extremity support majority of the time but did brace arm on // bar due to loss of balance  about 3-4 times. Patient had several small losses of balance that he was able to self correct.  Patient required CGA.  Patient performed 6 reps sideways step up, over and return without upper extremity support. Patient's legs became too fatigued to complete 10 reps this date. Patient required a short, sitting rest break after activity.       PT Education - 03/28/17 1330    Education provided  Yes    Education Details  reviewed HEP, added progression of pillow and increased reps from 10-15 with sit to stand exercise    Person(s) Educated  Patient    Methods  Explanation  PT Short Term Goals - 03/28/17 1332      PT SHORT TERM GOAL #1   Title  Patient will be able to perform home program independently for self-management.    Time  4    Period  Weeks    Status  Achieved        PT Long Term Goals - 03/28/17 1330      PT LONG TERM GOAL #1   Title  Patient will demonstrate reduced falls risk as evidenced by Dynamic Gait Index (DGI) >19/24.    Baseline  17/24 on 02/21/17    Time  8    Period  Weeks    Status  On-going      PT LONG TERM GOAL #2   Title  Patient will reduce falls risk as indicated by Activities Specific Balance Confidence Scale (ABC) >67%.    Baseline   74.4% on 02/21/17    Time  8    Period  Weeks    Status  On-going      PT LONG TERM GOAL #3   Title  Patient will reduce perceived disability to low levels as indicated by <40 on Dizziness Handicap Inventory to demonstrate significant improvement in dizziness.    Baseline  42/100 on 02/21/17    Time  8    Period  Weeks    Status  On-going      PT LONG TERM GOAL #4   Title  Patient will be able to return to going to the gym weekly to resume his prior activity level.     Time  8    Period  Weeks    Status  On-going      PT LONG TERM GOAL #5   Title  Patient will report that he has been able to resume dining out and visiting with his family and friends as these are activities that he enjoyed prior to  onset of his symptoms.     Time  8    Period  Weeks    Status  On-going            Plan - 03/28/17 1332    Clinical Impression Statement  Patient reports compliance with home exercise program but states his endurance level is low and that he has to break up the exercises into 2 or 4 parts in order to complete everything. Patient states that he has been feeling weak the last few weeks and has missed his last 2 appointments. Patient reports he has an appointment on Monday to see his doctor and get blood work completed. Noted patient's blood pressure was elevated today but improved during session. Patient able to progress sit to stand exercise by increasing repetitions and using Airex pad. Patient did well using Nustep machine. Patient challenged by cone tapping especially on compliant surface and would benefit from repeated practice. Patient also challenged by forward and sidestepping onto/off 6" wooden step. Patient encouraged to follow-up as indicated.      Rehab Potential  Good    Clinical Impairments Affecting Rehab Potential  positive indicators: motivated, family support  negative indicators: potential central and peripheral signs, chronicity, history of diplopia    PT Frequency  1x / week    PT Duration  8 weeks    PT Treatment/Interventions  Canalith Repostioning;Gait training;Stair training;Functional mobility training;Therapeutic activities;Patient/family education;Neuromuscular re-education;Balance training;Therapeutic exercise;Vestibular    PT Next Visit Plan  leg press, biodex tower, nustep, balance progressions- airex pad, cone tapping, alternate foot tapping; in  future sessions, consider taking patient over to [facility] to do strengthening exercises on machines in prep for him returning to the gym    PT Home Exercise Plan  VOR x 1 in standing with conflicting background 1 minute reps, feet together and semi-tandem progressions with and without vertical and horizontal head turns  and body turns, slow marching, sit to stand    Consulted and Agree with Plan of Care  Patient       Patient will benefit from skilled therapeutic intervention in order to improve the following deficits and impairments:  Decreased balance, Dizziness, Decreased mobility, Decreased strength, Difficulty walking  Visit Diagnosis: Dizziness and giddiness     Problem List Patient Active Problem List   Diagnosis Date Noted  . Nephrolithiasis 03/09/2017  . Atrial fibrillation ([place]) 06/16/2015  . Chronic diastolic CHF (congestive heart failure) ([place]) 06/16/2015  . Aortic root dilatation ([place]) 06/16/2015  . New onset atrial fibrillation ([place]) 05/31/2015  . Pneumonia due to other specified infectious organisms 05/31/2015  . Acute on chronic diastolic congestive heart failure ([place]) 05/31/2015  . Acute respiratory distress 05/31/2015  . Hyponatremia 05/27/2015  . Weakness generalized 05/27/2015  . HTN, goal below 140/80 05/27/2015  . Tremor 05/27/2015   Lady Deutscher PT, DPT [phone] Lady Deutscher 03/28/2017, 1:51 PM  [place] MAIN [facility] SERVICES [address] [place], [place], [address] Phone: [phone]   Fax:  [phone]  Name: Derek James MRN: [id number] Date of Birth: 29-Dec-1930

## 2017-04-04 ENCOUNTER — Encounter: Payer: Self-pay | Admitting: Physical Therapy

## 2017-04-04 ENCOUNTER — Ambulatory Visit: Payer: Medicare Other | Admitting: Physical Therapy

## 2017-04-04 DIAGNOSIS — R42 Dizziness and giddiness: Secondary | ICD-10-CM | POA: Diagnosis not present

## 2017-04-04 NOTE — Therapy (Signed)
[place] MAIN [facility] SERVICES [address] [place], [place], [address] Phone: [phone]   Fax:  [phone]  Physical Therapy Treatment  Patient Details  Name: Derek James MRN: [id number] Date of Birth: 04-19-1930 Referring Provider: Dr. Joselyn Arrow   Encounter Date: 04/04/2017  PT End of Session - 04/04/17 1118    Visit Number  5    Number of Visits  9    Date for PT Re-Evaluation  04/18/17    PT Start Time  1116    PT Stop Time  1205    PT Time Calculation (min)  49 min    Equipment Utilized During Treatment  Gait belt    Activity Tolerance  Patient tolerated treatment well    Behavior During Therapy  [facility] for tasks assessed/performed       Past Medical History:  Diagnosis Date  . A-fib ([place])    a. new onset 05/2015; b. CHADS2VASC => 4 (CHF, HTN, age x 2)-->Xarelto initiated-->converted to sinus w/ pac's  . Cancer ([place])    skin ca  . Chronic diastolic CHF (congestive heart failure) ([place])    a. echo 05/2015: Ef 50-55%, rhythm Afib, moderately dilated aortic root, mild AI, moderate MR, LA normal in size, PASP 44 mmHg  . GERD (gastroesophageal reflux disease)   . Hypertension   . Rhinitis     Past Surgical History:  Procedure Laterality Date  . COLONOSCOPY WITH PROPOFOL N/A 09/05/2014   Procedure: COLONOSCOPY WITH PROPOFOL;  Surgeon: Manya Silvas, MD;  Location: [facility] ENDOSCOPY;  Service: Endoscopy;  Laterality: N/A;  . EYE SURGERY     retinal detatchment  . HERNIA REPAIR    . INNER EAR SURGERY      There were no vitals filed for this visit.  Subjective Assessment - 04/04/17 1117    Subjective  Patient reports that his blood work was completed on Monday and he states all the results were okay. Patient states that the home exercises are still challenging. Patient states that he did try doing some of the exercises standing on a pillow for the sit to stands and mini-squats.     Pertinent History  Patient reports he was going  to the gym 4-6 times a week in 2017. Patient states he had been going to the gym regularly from [phone]. Patient had pneumonia from mold in his house and as also diagnosed with A-fib in 2017 per patient report. Patient reports he had lots of side effects from new medications and was loosing weight during 2017 causing him to stop going to the gym. Patient states he tried to return to the gym. Patient reports he has not been to the gym now in 4 or 5 months because he states "I just got so weak". Patient states that it is a goal of his to be able to return to the gym. Patient reports that he lost 18 pounds initially and states he has talked with his physicians about this issue. Patient states he went to see Dr. Brigitte Pulse neurologist and urologist. Patient reports that he has had a balance problem since 1982 because he had 3 ear surgeries that that he felt directly impacted his balance. 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 Patient reports that after his ear surgeries he felt it impacted his balance but states he did not experience dizziness. Patient states  that since he had "the sick spell in 2017", he started to get more dizziness and imbalance as compared to his basline. Patient reports that he gets temporary dizziness depending upon what he does. Patient reports that he walks and looks to the side he can become imbalanced which he states is nothing new. Patient reports that he is not able to balance if he is trying to walk in the dark, but denies dizziness with this activity. Patient reports that he had hearing tests at ENT office ,but he is not sure if he had any formal vestibular testing. Described VNG test and this was not familiar to patient. Patient describes his dizziness as temporarily his vision does not focus as well with blurriness at times. Patient reports that he had surgery for a detached  retina on 08/1997. Patient reports that he began to have double vision and had surgery for this condition 04/1998 and 10/1998. Patient states that this helped but did not resolve completely his symptoms of diplopia. Per patient's self-reported history, he suffered a mini stroke via MRI on 10/22/2006.     Diagnostic tests  MRI brain 10/23/2016 revealed per MR: remote lacunar infarct involving left thalamus and external capsule, atrophy and white matter disease.     Currently in Pain?  No/denies        Patient states that the home exercises are still challenging. Patient states that he did try doing some of the exercises standing on a pillow for the sit to stands and mini-squats.   Leg Press: Performed  #100 pounds 2 reps. Then, increased to #120 pounds.  Patient performed leg press machine #120 pounds 2 sets of 15 reps with verbal cue to not lock knees in extension.  Patient reports fatigue in quads with this activity.  Patient able to perform demonstrating good technique after initial cuing to not lock out knees in extension.  Biodex [employer] System: Performed [employer] cable system machine walking forward 8 reps and sidestepping to the left and to the right 5 reps each with 12.5 pounds resistance.   Airex balance beam: On Airex balance beam, performed sideways stepping 5' times multiple reps. Patient required 2 finger assist for balance with first rep and then was able to perform without UEs support.  Patient with several losses of balance requiring CGA to Min A to correct. Patient would benefit from continued practice with this exercise.       PT Education - 04/04/17 1118    Education provided  Yes    Education Details  discussed HEP and progression of pillow. reviewed safety with HEP    Person(s) Educated  Patient    Methods  Explanation    Comprehension  Verbalized understanding       PT Short Term Goals - 03/28/17 1332      PT SHORT TERM GOAL #1   Title  Patient will be  able to perform home program independently for self-management.    Time  4    Period  Weeks    Status  Achieved        PT Long Term Goals - 04/04/17 1224      PT LONG TERM GOAL #1   Title  Patient will demonstrate reduced falls risk as evidenced by Dynamic Gait Index (DGI) >19/24.    Baseline  17/24 on 02/21/17    Time  8    Period  Weeks    Status  On-going      PT LONG TERM GOAL #  2   Title  Patient will reduce falls risk as indicated by Activities Specific Balance Confidence Scale (ABC) >67%.    Baseline   74.4% on 02/21/17    Time  8    Period  Weeks    Status  On-going      PT LONG TERM GOAL #3   Title  Patient will reduce perceived disability to low levels as indicated by <40 on Dizziness Handicap Inventory to demonstrate significant improvement in dizziness.    Baseline  42/100 on 02/21/17    Time  8    Period  Weeks    Status  On-going      PT LONG TERM GOAL #4   Title  Patient will be able to return to going to the gym weekly to resume his prior activity level.     Time  8    Period  Weeks    Status  On-going      PT LONG TERM GOAL #5   Title  Patient will report that he has been able to resume dining out and visiting with his family and friends as these are activities that he enjoyed prior to onset of his symptoms.     Time  8    Period  Weeks    Status  On-going            Plan - 04/04/17 1119    Clinical Impression Statement  Patient reports continued compliance with home exercise program and states he was able to progress to using pillow for sit to stand and mini-squat exercises. Patient did well with leg press machine today. Will plan on continuing leg press and Biodex tower walking with increasing weights as tolerated by patient. Patient challenged by airex balance beam activity and will plan on repeating next session. Patient would continue to benefit from PT services to further work on balance and strengthening exercises and to try to reduce falls risk.      Rehab Potential  Good    Clinical Impairments Affecting Rehab Potential  positive indicators: motivated, family support  negative indicators: potential central and peripheral signs, chronicity, history of diplopia    PT Frequency  1x / week    PT Duration  8 weeks    PT Treatment/Interventions  Canalith Repostioning;Gait training;Stair training;Functional mobility training;Therapeutic activities;Patient/family education;Neuromuscular re-education;Balance training;Therapeutic exercise;Vestibular    PT Next Visit Plan  leg press, biodex tower, nustep, balance progressions- airex pad, cone tapping, alternate foot tapping; in future sessions, consider taking patient over to [employer] [facility] to do strengthening exercises on machines in prep for him returning to the gym    PT Home Exercise Plan  VOR x 1 in standing with conflicting background 1 minute reps, feet together and semi-tandem progressions with and without vertical and horizontal head turns and body turns, slow marching, sit to stand    Consulted and Agree with Plan of Care  Patient       Patient will benefit from skilled therapeutic intervention in order to improve the following deficits and impairments:  Decreased balance, Dizziness, Decreased mobility, Decreased strength, Difficulty walking  Visit Diagnosis: Dizziness and giddiness     Problem List Patient Active Problem List   Diagnosis Date Noted  . Nephrolithiasis 03/09/2017  . Atrial fibrillation ([place]) 06/16/2015  . Chronic diastolic CHF (congestive heart failure) ([place]) 06/16/2015  . Aortic root dilatation ([place]) 06/16/2015  . New onset atrial fibrillation ([place]) 05/31/2015  . Pneumonia due to other specified infectious organisms 05/31/2015  .  Acute on chronic diastolic congestive heart failure ([place]) 05/31/2015  . Acute respiratory distress 05/31/2015  . Hyponatremia 05/27/2015  . Weakness generalized 05/27/2015  . HTN, goal below 140/80 05/27/2015  . Tremor 05/27/2015    Lady Deutscher PT, DPT [phone] Lady Deutscher 04/04/2017, 12:26 PM  [place] MAIN [facility] SERVICES [address] [place], [place], [address] Phone: [phone]   Fax:  [phone]  Name: Derek James MRN: [id number] Date of Birth: Jan 10, 1931

## 2017-04-11 ENCOUNTER — Encounter: Payer: Self-pay | Admitting: Physical Therapy

## 2017-04-11 ENCOUNTER — Ambulatory Visit: Payer: Medicare Other | Attending: Neurology | Admitting: Physical Therapy

## 2017-04-11 DIAGNOSIS — R42 Dizziness and giddiness: Secondary | ICD-10-CM | POA: Diagnosis present

## 2017-04-11 NOTE — Therapy (Signed)
[place] MAIN [facility] SERVICES [address] [place], [place], [address] Phone: [phone]   Fax:  [phone]  Physical Therapy Treatment  Patient Details  Name: Derek James MRN: [id number] Date of Birth: Jun 25, 1930 Referring Provider: Dr. Joselyn Arrow   Encounter Date: 04/11/2017  PT End of Session - 04/11/17 1121    Visit Number  6    Number of Visits  9    Date for PT Re-Evaluation  04/18/17    PT Start Time  1118    PT Stop Time  1208    PT Time Calculation (min)  50 min    Equipment Utilized During Treatment  Gait belt    Activity Tolerance  Patient tolerated treatment well    Behavior During Therapy  [facility] for tasks assessed/performed       Past Medical History:  Diagnosis Date  . A-fib ([place])    a. new onset 05/2015; b. CHADS2VASC => 4 (CHF, HTN, age x 2)-->Xarelto initiated-->converted to sinus w/ pac's  . Cancer ([place])    skin ca  . Chronic diastolic CHF (congestive heart failure) ([place])    a. echo 05/2015: Ef 50-55%, rhythm Afib, moderately dilated aortic root, mild AI, moderate MR, LA normal in size, PASP 44 mmHg  . GERD (gastroesophageal reflux disease)   . Hypertension   . Rhinitis     Past Surgical History:  Procedure Laterality Date  . COLONOSCOPY WITH PROPOFOL N/A 09/05/2014   Procedure: COLONOSCOPY WITH PROPOFOL;  Surgeon: Manya Silvas, MD;  Location: [facility] ENDOSCOPY;  Service: Endoscopy;  Laterality: N/A;  . EYE SURGERY     retinal detatchment  . HERNIA REPAIR    . INNER EAR SURGERY      There were no vitals filed for this visit.  Subjective Assessment - 04/11/17 1120    Subjective  Patient reports that he tried going to the mall last week and walking. Patient states he got tired at the end and needed to call someone to get him. Discussed pacing himself, walking program and rest breaks. Patient states that he does not like to get out in the cold weather and is hoping to get more active when it gets warmer.  Encouraged patient to try to continue mall walking as it is climate controlled and there are many benches for him to sit if he needs a rest break.     Pertinent History  Patient reports he was going to the gym 4-6 times a week in 2017. Patient states he had been going to the gym regularly from [phone]. Patient had pneumonia from mold in his house and as also diagnosed with A-fib in 2017 per patient report. Patient reports he had lots of side effects from new medications and was loosing weight during 2017 causing him to stop going to the gym. Patient states he tried to return to the gym. Patient reports he has not been to the gym now in 4 or 5 months because he states "I just got so weak". Patient states that it is a goal of his to be able to return to the gym. Patient reports that he lost 18 pounds initially and states he has talked with his physicians about this issue. Patient states he went to see Dr. Brigitte Pulse neurologist and urologist. Patient reports that he has had a balance problem since 1982 because he had 3 ear surgeries that that he felt directly impacted his balance. 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 Patient reports that after his ear surgeries he felt it impacted his balance but states he did not experience dizziness. Patient states that since he had "the sick spell in 2017", he started to get more dizziness and imbalance as compared to his basline. Patient reports that he gets temporary dizziness depending upon what he does. Patient reports that he walks and looks to the side he can become imbalanced which he states is nothing new. Patient reports that he is not able to balance if he is trying to walk in the dark, but denies dizziness with this activity. Patient reports that he had hearing tests at ENT office ,but he is not sure if he had any formal vestibular testing. Described VNG  test and this was not familiar to patient. Patient describes his dizziness as temporarily his vision does not focus as well with blurriness at times. Patient reports that he had surgery for a detached retina on 08/1997. Patient reports that he began to have double vision and had surgery for this condition 04/1998 and 10/1998. Patient states that this helped but did not resolve completely his symptoms of diplopia. Per patient's self-reported history, he suffered a mini stroke via MRI on 10/22/2006.     Diagnostic tests  MRI brain 10/23/2016 revealed per MR: remote lacunar infarct involving left thalamus and external capsule, atrophy and white matter disease.          Physical Performance: Performed DGI, ABC scale and DHI functional outcome measures. Discussed test results and compared to prior testing. Discussed community based balance and exercise programs including [employer]. Provided handout on gym information.  FUNCTIONAL OUTCOME MEASURES:  Results Comments  DHI 24/100 low perception of handicap; in need of intervention  ABC Scale 78.8% Falls risk; in need of intervention  DGI 20/24 Mild impairment; in need of intervention    Therapeutic Exercise: Leg Press: Patient performed leg press machine #130 pounds 2 sets of 15 reps with verbal cue to go slower and control.   Neuromuscular Re-education: Airex balance beam:  On Airex balance beam, performed sideways stepping 5' times multiple reps. Patient had many small losses of balance that he was able to self correct using ankle and hip strategies. He did very well with not reaching for the // bar for support. He did have 2 losses of balance posteriorly where he braced his body against the bar to regain balance but over 90% of the time he was able to rely on hip and ankle strategies to regain balance which is an improvement compared to prior sessions.         PT Education - 04/11/17 1120    Education provided  Yes     Person(s) Educated  Patient    Methods  Explanation    Comprehension  Verbalized understanding       PT Short Term Goals - 03/28/17 1332      PT SHORT TERM GOAL #1   Title  Patient will be able to perform home program independently for self-management.    Time  4    Period  Weeks    Status  Achieved        PT Long Term Goals - 04/11/17 1439      PT LONG TERM GOAL #1   Title  Patient will demonstrate reduced falls risk as evidenced by Dynamic Gait Index (DGI) >19/24.  Baseline  17/24 on 02/21/17    Time  8    Period  Weeks    Status  Achieved      PT LONG TERM GOAL #2   Title  Patient will reduce falls risk as indicated by Activities Specific Balance Confidence Scale (ABC) >80%.    Baseline   74.4% on 02/21/17    Time  8    Period  Weeks    Status  Partially Met      PT LONG TERM GOAL #3   Title  Patient will reduce perceived disability to low levels as indicated by <40 on Dizziness Handicap Inventory to demonstrate significant improvement in dizziness.    Baseline  42/100 on 02/21/17    Time  8    Period  Weeks    Status  Achieved      PT LONG TERM GOAL #4   Title  Patient will be able to return to going to the gym weekly to resume his prior activity level.     Baseline  reports that he started to resume walking but has not been back to the gym yet    Time  8    Period  Weeks    Status  Partially Met      PT LONG TERM GOAL #5   Title  Patient will report that he has been able to resume dining out and visiting with his family and friends as these are activities that he enjoyed prior to onset of his symptoms.     Baseline  patient reports that he does not like to go out when the weather is cold and states he hopes to resume these activities once it is warmer    Time  8    Period  Weeks    Status  On-going            Plan - 04/11/17 1121    Clinical Impression Statement  Repeated functional outcome testing this date. Patient demonstrated good improvement on  the [place] decreasing from 42/100 (moderate) to 24/100 (low perception of handicap).  Patient improved from 74 to 78.8% on teh ABC scale and from 17/24 to 20/24 on the DGI. Patient met 1/1 STGs and 2/5 long term goals.  Patient has demonstrated improvements in his balance but would benefit from continued PT services to further address balance and strengthening exercises and to try to address remaining goals.     Rehab Potential  Good    Clinical Impairments Affecting Rehab Potential  positive indicators: motivated, family support  negative indicators: potential central and peripheral signs, chronicity, history of diplopia    PT Frequency  1x / week    PT Duration  8 weeks    PT Treatment/Interventions  Canalith Repostioning;Gait training;Stair training;Functional mobility training;Therapeutic activities;Patient/family education;Neuromuscular re-education;Balance training;Therapeutic exercise;Vestibular    PT Next Visit Plan  leg press, biodex tower, nustep, balance progressions- airex pad, cone tapping, alternate foot tapping; in future sessions, consider taking patient over to [facility] to do strengthening exercises on machines in prep for him returning to the gym    PT Home Exercise Plan  VOR x 1 in standing with conflicting background 1 minute reps, feet together and semi-tandem progressions with and without vertical and horizontal head turns and body turns, slow marching, sit to stand    Consulted and Agree with Plan of Care  Patient       Patient will benefit from skilled therapeutic intervention in order to improve the following  deficits and impairments:  Decreased balance, Dizziness, Decreased mobility, Decreased strength, Difficulty walking  Visit Diagnosis: Dizziness and giddiness     Problem List Patient Active Problem List   Diagnosis Date Noted  . Nephrolithiasis 03/09/2017  . Atrial fibrillation ([place]) 06/16/2015  . Chronic diastolic CHF (congestive heart failure) ([place])  06/16/2015  . Aortic root dilatation ([place]) 06/16/2015  . New onset atrial fibrillation ([place]) 05/31/2015  . Pneumonia due to other specified infectious organisms 05/31/2015  . Acute on chronic diastolic congestive heart failure ([place]) 05/31/2015  . Acute respiratory distress 05/31/2015  . Hyponatremia 05/27/2015  . Weakness generalized 05/27/2015  . HTN, goal below 140/80 05/27/2015  . Tremor 05/27/2015   Lady Deutscher PT, DPT [phone] Lady Deutscher 04/11/2017, 2:44 PM  [place] MAIN [facility] SERVICES [address] [place], [place], [address] Phone: [phone]   Fax:  [phone]  Name: Derek James MRN: [id number] Date of Birth: 1931-01-21

## 2017-04-15 ENCOUNTER — Ambulatory Visit: Payer: Medicare Other | Admitting: Physical Therapy

## 2017-04-18 ENCOUNTER — Ambulatory Visit: Payer: Medicare Other | Admitting: Physical Therapy

## 2017-04-25 ENCOUNTER — Ambulatory Visit: Payer: Medicare Other | Admitting: Physical Therapy

## 2017-05-02 ENCOUNTER — Encounter: Payer: Self-pay | Admitting: Physical Therapy

## 2017-05-02 ENCOUNTER — Ambulatory Visit: Payer: Medicare Other | Admitting: Physical Therapy

## 2017-05-02 VITALS — BP 135/63 | HR 102

## 2017-05-02 DIAGNOSIS — R42 Dizziness and giddiness: Secondary | ICD-10-CM

## 2017-05-02 NOTE — Addendum Note (Signed)
Addended by: Lorna Dibble on: 05/02/2017 02:24 PM   Modules accepted: Orders

## 2017-05-02 NOTE — Therapy (Addendum)
[place] MAIN [facility] SERVICES [address] [place], [place], [address] Phone: [phone]   Fax:  [phone]  Physical Therapy Treatment  Patient Details  Name: Derek James MRN: [id number] Date of Birth: 1930-02-23 Referring Provider: Dr. Joselyn Arrow   Encounter Date: 05/02/2017  PT End of Session - 05/02/17 1302    Visit Number  7    Number of Visits  14    Date for PT Re-Evaluation  05/30/17    PT Start Time  1302    PT Stop Time  1350    PT Time Calculation (min)  48 min    Equipment Utilized During Treatment  Gait belt    Activity Tolerance  Patient tolerated treatment well    Behavior During Therapy  [facility] for tasks assessed/performed       Past Medical History:  Diagnosis Date  . A-fib ([place])    a. new onset 05/2015; b. CHADS2VASC => 4 (CHF, HTN, age x 2)-->Xarelto initiated-->converted to sinus w/ pac's  . Cancer ([place])    skin ca  . Chronic diastolic CHF (congestive heart failure) ([place])    a. echo 05/2015: Ef 50-55%, rhythm Afib, moderately dilated aortic root, mild AI, moderate MR, LA normal in size, PASP 44 mmHg  . GERD (gastroesophageal reflux disease)   . Hypertension   . Rhinitis     Past Surgical History:  Procedure Laterality Date  . COLONOSCOPY WITH PROPOFOL N/A 09/05/2014   Procedure: COLONOSCOPY WITH PROPOFOL;  Surgeon: Manya Silvas, MD;  Location: [facility] ENDOSCOPY;  Service: Endoscopy;  Laterality: N/A;  . EYE SURGERY     retinal detatchment  . HERNIA REPAIR    . INNER EAR SURGERY      Vitals:   05/02/17 1414  BP: 135/63  Pulse: (!) 102  SpO2: 97%    Subjective Assessment - 05/02/17 1301    Subjective  Patient reports he has been mall walking, but states he noticed that his arms-shoulders bilaterally and neck was hurting him after walking 1/2 the loop of the mall. Patient states that after this happened a second day he made an appointment with his cardiologist. Patient states he went to his cardiologist  on Tuesday. Patient reports he had a chest x-ray done which revealed "a chronic change" in his lungs per patient report and states that he had an EKG which revealed nothing concerning per patient report. Patient reports that his cardiologists told him not to stop exercising and to follow up with his primary care physician in regards to the lung changes. Patient reports that he went mall walking again yesterday and that he had the same discomfort, but that he took a 15 minute sitting rest break on a bench and then he was able to complete the other 1/2 of the mall loop without problem. Discussed with patient importance of taking rest breaks as needed. Patient states he really wants to be able to return to the gym. Patient states he plans to go to [employer].    Pertinent History  Patient reports he was going to the gym 4-6 times a week in 2017. Patient states he had been going to the gym regularly from [phone]. Patient had pneumonia from mold in his house and as also diagnosed with A-fib in 2017 per patient report. Patient reports he had lots of side effects from new medications and was loosing weight during 2017 causing him to stop going to the gym. Patient states he tried to return  to the gym. Patient reports he has not been to the gym now in 4 or 5 months because he states "I just got so weak". Patient states that it is a goal of his to be able to return to the gym. Patient reports that he lost 18 pounds initially and states he has talked with his physicians about this issue. Patient states he went to see Dr. Brigitte Pulse neurologist and urologist. Patient reports that he has had a balance problem since 1982 because he had 3 ear surgeries that that he felt directly impacted his balance. 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 Patient reports that after his ear surgeries he felt it impacted his balance  but states he did not experience dizziness. Patient states that since he had "the sick spell in 2017", he started to get more dizziness and imbalance as compared to his basline. Patient reports that he gets temporary dizziness depending upon what he does. Patient reports that he walks and looks to the side he can become imbalanced which he states is nothing new. Patient reports that he is not able to balance if he is trying to walk in the dark, but denies dizziness with this activity. Patient reports that he had hearing tests at ENT office ,but he is not sure if he had any formal vestibular testing. Described VNG test and this was not familiar to patient. Patient describes his dizziness as temporarily his vision does not focus as well with blurriness at times. Patient reports that he had surgery for a detached retina on 08/1997. Patient reports that he began to have double vision and had surgery for this condition 04/1998 and 10/1998. Patient states that this helped but did not resolve completely his symptoms of diplopia. Per patient's self-reported history, he suffered a mini stroke via MRI on 10/22/2006.     Diagnostic tests  MRI brain 10/23/2016 revealed per MR: remote lacunar infarct involving left thalamus and external capsule, atrophy and white matter disease.     Currently in Pain?  No/denies         Patient reports he has been mall walking, but states he noticed that his arms-shoulders bilaterally and neck was hurting him after walking 1/2 the loop of the mall. Patient states that after this happened a second day he made an appointment with his cardiologist. Patient states he went to his cardiologist on Tuesday. Patient reports he had a chest x-ray done which revealed "a chronic change" in his lungs per patient report and states that he had an EKG which revealed nothing concerning per patient report. Patient reports that his cardiologists told him not to stop exercising and to follow up with his primary  care physician in regards to the lung changes. Patient reports that he went mall walking again yesterday and that he had the same discomfort, but that he took a 15 minute sitting rest break on a bench and then he was able to complete the other 1/2 of the mall loop without problem. Discussed with patient importance of taking rest breaks as needed. Patient states he really wants to be able to return to the gym. Patient states he plans to go to [employer]. Practiced doing exercises on gym equipment this date in therapy and discussed gradually returning to the gym using similar machines, weights and reps and gradually building from that level.  Therapeutic Exercise: Patient performed exercises on the gym equipment in the fitness gym as this equipment most closely resembles the machines that he will be using at [employer]. Performed the following exercises: Leg press machine  #120 2 sets of 10 reps; verbal cues to not lock knees Toe raise   #100 2 sets of 10 reps; verbal cues for technique Incline chest press #30 2 sets of 10 reps; verbal cues to breathe out with pushing exertion Lat pull down   #20 2 sets of 10 reps; verbal cues for hand placement on bar Midrow   #20 2 sets of 10 reps; verbal cues for form  Patient's vital signs obtained after there ex as follows: BP 135/63 mmHg HR 102 bpm Oxygen sats 97%    PT Education - 05/02/17 1301    Education provided  Yes    Education Details  discussed patient's goal of returning to a gym for exercise; educated as to proper use of gym equipment at the [employer] [facility] gym. Practiced doing exercises on gym equipment this date in therapy and discussed gradually returning to the gym using similar machines, weights and reps and gradually building from that level.      Person(s) Educated  Patient    Methods  Explanation;Demonstration;Verbal cues    Comprehension  Verbalized understanding;Returned demonstration       PT Short Term Goals -  03/28/17 1332      PT SHORT TERM GOAL #1   Title  Patient will be able to perform home program independently for self-management.    Time  4    Period  Weeks    Status  Achieved        PT Long Term Goals - 04/11/17 1439      PT LONG TERM GOAL #1   Title  Patient will demonstrate reduced falls risk as evidenced by Dynamic Gait Index (DGI) >19/24.    Baseline  17/24 on 02/21/17    Time  8    Period  Weeks    Status  Achieved      PT LONG TERM GOAL #2   Title  Patient will reduce falls risk as indicated by Activities Specific Balance Confidence Scale (ABC) >80%.    Baseline   74.4% on 02/21/17    Time  8    Period  Weeks    Status  Partially Met      PT LONG TERM GOAL #3   Title  Patient will reduce perceived disability to low levels as indicated by <40 on Dizziness Handicap Inventory to demonstrate significant improvement in dizziness.    Baseline  42/100 on 02/21/17    Time  8    Period  Weeks    Status  Achieved      PT LONG TERM GOAL #4   Title  Patient will be able to return to going to the gym weekly to resume his prior activity level.     Baseline  reports that he started to resume walking but has not been back to the gym yet    Time  8    Period  Weeks    Status  Partially Met      PT LONG TERM GOAL #5   Title  Patient will report that he has been able to resume dining out and visiting with his family and friends as these are activities that he enjoyed prior to onset of his symptoms.     Baseline  patient reports that he  does not like to go out when the weather is cold and states he hopes to resume these activities once it is warmer    Time  8    Period  Weeks    Status  On-going            Plan - 05/02/17 1302    Clinical Impression Statement  Repeated functional outcome testing last session. Patient demonstrated good improvement on the [place] decreasing from 42/100 (moderate) to 24/100 (low perception of handicap). Patient improved from 74 to 78.8% on the ABC  scale and from 17/24 to 20/24 on the DGI. Patient met 1/1 STGs and 2/5 long term goals. Patient this date worked on performing exercises on gym equipment that he can find at [employer]. Patient reports that his goal is to be able to go to [employer] and work out regularly again. Patient did well this date with activities on the gym equipment with verbal cuing for technique. Selected machine exercises that [employer] will have for carry over. Patient would benefit from continued PT services to further work towards goals as set on plan of care and to work towards patient's goal of being able to return to working out in a gym facility.     Rehab Potential  Good    Clinical Impairments Affecting Rehab Potential  positive indicators: motivated, family support  negative indicators: potential central and peripheral signs, chronicity, history of diplopia    PT Frequency  1x / week    PT Duration  8 weeks    PT Treatment/Interventions  Canalith Repostioning;Gait training;Stair training;Functional mobility training;Therapeutic activities;Patient/family education;Neuromuscular re-education;Balance training;Therapeutic exercise;Vestibular    PT Next Visit Plan  leg press, biodex tower, nustep, balance progressions- airex pad, cone tapping, alternate foot tapping; continue [employer] exercises to do strengthening exercises on machines in prep for him returning to the gym    PT Home Exercise Plan  VOR x 1 in standing with conflicting background 1 minute reps, feet together and semi-tandem progressions with and without vertical and horizontal head turns and body turns, slow marching, sit to stand    Consulted and Agree with Plan of Care  Patient       Patient will benefit from skilled therapeutic intervention in order to improve the following deficits and impairments:  Decreased balance, Dizziness, Decreased mobility, Decreased strength, Difficulty walking  Visit Diagnosis: Dizziness and  giddiness     Problem List Patient Active Problem List   Diagnosis Date Noted  . Nephrolithiasis 03/09/2017  . Atrial fibrillation ([place]) 06/16/2015  . Chronic diastolic CHF (congestive heart failure) ([place]) 06/16/2015  . Aortic root dilatation ([place]) 06/16/2015  . New onset atrial fibrillation ([place]) 05/31/2015  . Pneumonia due to other specified infectious organisms 05/31/2015  . Acute on chronic diastolic congestive heart failure ([place]) 05/31/2015  . Acute respiratory distress 05/31/2015  . Hyponatremia 05/27/2015  . Weakness generalized 05/27/2015  . HTN, goal below 140/80 05/27/2015  . Tremor 05/27/2015   Lady Deutscher PT, DPT [phone] Lady Deutscher 05/02/2017, 2:22 PM  [place] MAIN [facility] [address] [place], [place], [address] Phone: [phone]   Fax:  [phone]  Name: Derek James MRN: [id number] Date of Birth: [birth year]

## 2017-05-06 ENCOUNTER — Encounter: Payer: Self-pay | Admitting: Physical Therapy

## 2017-05-06 ENCOUNTER — Ambulatory Visit: Payer: Medicare Other | Admitting: Physical Therapy

## 2017-05-06 VITALS — BP 116/56

## 2017-05-06 DIAGNOSIS — R42 Dizziness and giddiness: Secondary | ICD-10-CM

## 2017-05-06 NOTE — Therapy (Addendum)
[place] MAIN [facility] SERVICES [address] [place], [place], [address] Phone: [phone]   Fax:  [phone]  Physical Therapy Treatment  Patient Details  Name: Derek James MRN: [id number] Date of Birth: 1930-12-06 Referring Provider: Dr. Joselyn Arrow   Encounter Date: 05/06/2017  PT End of Session - 05/06/17 1042    Visit Number  8    Number of Visits  14    Date for PT Re-Evaluation  05/30/17    PT Start Time  1042    PT Stop Time  1130    PT Time Calculation (min)  48 min    Equipment Utilized During Treatment  Gait belt    Activity Tolerance  Patient tolerated treatment well    Behavior During Therapy  [facility] for tasks assessed/performed       Past Medical History:  Diagnosis Date  . A-fib ([place])    a. new onset 05/2015; b. CHADS2VASC => 4 (CHF, HTN, age x 2)-->Xarelto initiated-->converted to sinus w/ pac's  . Cancer ([place])    skin ca  . Chronic diastolic CHF (congestive heart failure) ([place])    a. echo 05/2015: Ef 50-55%, rhythm Afib, moderately dilated aortic root, mild AI, moderate MR, LA normal in size, PASP 44 mmHg  . GERD (gastroesophageal reflux disease)   . Hypertension   . Rhinitis     Past Surgical History:  Procedure Laterality Date  . COLONOSCOPY WITH PROPOFOL N/A 09/05/2014   Procedure: COLONOSCOPY WITH PROPOFOL;  Surgeon: Manya Silvas, MD;  Location: [facility] ENDOSCOPY;  Service: Endoscopy;  Laterality: N/A;  . EYE SURGERY     retinal detatchment  . HERNIA REPAIR    . INNER EAR SURGERY      Vitals:   05/06/17 1135 05/06/17 1239  BP: (!) 107/48 (!) 116/56     Subjective Assessment - 05/06/17 1041    Subjective  Patient reports that he is going to have a cardiac stress test on April 3rd and then he is going to have a pulmonology MD appointment. Patient states that he felt better after doing the gym exercises last session. Patient reports that he was not sore afterwards. Patient states he has not done further  mall walking yet.    Pertinent History  Patient reports he was going to the gym 4-6 times a week in 2017. Patient states he had been going to the gym regularly from [phone]. Patient had pneumonia from mold in his house and as also diagnosed with A-fib in 2017 per patient report. Patient reports he had lots of side effects from new medications and was loosing weight during 2017 causing him to stop going to the gym. Patient states he tried to return to the gym. Patient reports he has not been to the gym now in 4 or 5 months because he states "I just got so weak". Patient states that it is a goal of his to be able to return to the gym. Patient reports that he lost 18 pounds initially and states he has talked with his physicians about this issue. Patient states he went to see Dr. Brigitte Pulse neurologist and urologist. Patient reports that he has had a balance problem since 1982 because he had 3 ear surgeries that that he felt directly impacted his balance. 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 Patient reports that after his ear surgeries  he felt it impacted his balance but states he did not experience dizziness. Patient states that since he had "the sick spell in 2017", he started to get more dizziness and imbalance as compared to his basline. Patient reports that he gets temporary dizziness depending upon what he does. Patient reports that he walks and looks to the side he can become imbalanced which he states is nothing new. Patient reports that he is not able to balance if he is trying to walk in the dark, but denies dizziness with this activity. Patient reports that he had hearing tests at ENT office ,but he is not sure if he had any formal vestibular testing. Described VNG test and this was not familiar to patient. Patient describes his dizziness as temporarily his vision does not focus as well with  blurriness at times. Patient reports that he had surgery for a detached retina on 08/1997. Patient reports that he began to have double vision and had surgery for this condition 04/1998 and 10/1998. Patient states that this helped but did not resolve completely his symptoms of diplopia. Per patient's self-reported history, he suffered a mini stroke via MRI on 10/22/2006.     Diagnostic tests  MRI brain 10/23/2016 revealed per MR: remote lacunar infarct involving left thalamus and external capsule, atrophy and white matter disease.         Neuromuscular Re-education: Step Ups: Patient performed step up onto 6" wooden step and backward return on Airex pad without upper extremity support 10 reps with CGA.  Patient performed standing on Airex pad 10 reps sideways step up onto 6" wooden step over and return without UEs support with CGA.  Patient with several losses of balance and patient was able to mostly correct with hip and ankle strategies and only reached with UEs for support 1-2 times.   Cone tapping: On firm surface, patient performed foot tapping to cones in series of one and two targets as called out by therapist. Patient with a few losses of balance that he was able to self correct, but needed to touch // bars a few times as well. In standing on Airex pad, patient performed foot tapping to cones in series of one and two cones as called out by therapist with CGA/Min A. Patient with multiple small losses of balance that he was able to self-correct using hip strategies. Patient much less reliant on UEs to regain balance this date.    Therapeutic Exercise: Patient performed exercises on the gym equipment in the fitness gym as this equipment most closely resembles the machines that he will be using at [employer]. Performed the following exercises: Leg press machine     #110 2 sets of 10 reps; verbal cues for breathing Lat pull down               #30 2 sets of 10 reps; verbal cues for  breathing Midrow                        #30 2 sets of 10 reps- patient demonstrated good technique   Note: patients blood pressure was taken after therapeutic exercise and it was 107/48 mmHg, oxygen saturation level was 97% and HR was 97 bpm. Patient denies dizziness. Patient given water to drink. Patient with 3 second recovery with skin turgor test on back of the hand time. Re took patient's blood pressure after about five minutes and it was 116/56 mmHg. Encouraged patient to drink water.  PT Education - 05/06/17 1042    Education provided  Yes    Education Details  reviewed gym machine exercises    Person(s) Educated  Patient    Methods  Explanation    Comprehension  Verbalized understanding;Returned demonstration       PT Short Term Goals - 03/28/17 1332      PT SHORT TERM GOAL #1   Title  Patient will be able to perform home program independently for self-management.    Time  4    Period  Weeks    Status  Achieved        PT Long Term Goals - 04/11/17 1439      PT LONG TERM GOAL #1   Title  Patient will demonstrate reduced falls risk as evidenced by Dynamic Gait Index (DGI) >19/24.    Baseline  17/24 on 02/21/17    Time  8    Period  Weeks    Status  Achieved      PT LONG TERM GOAL #2   Title  Patient will reduce falls risk as indicated by Activities Specific Balance Confidence Scale (ABC) >80%.    Baseline   74.4% on 02/21/17    Time  8    Period  Weeks    Status  Partially Met      PT LONG TERM GOAL #3   Title  Patient will reduce perceived disability to low levels as indicated by <40 on Dizziness Handicap Inventory to demonstrate significant improvement in dizziness.    Baseline  42/100 on 02/21/17    Time  8    Period  Weeks    Status  Achieved      PT LONG TERM GOAL #4   Title  Patient will be able to return to going to the gym weekly to resume his prior activity level.     Baseline  reports that he started to resume walking but has not been back to the gym yet     Time  8    Period  Weeks    Status  Partially Met      PT LONG TERM GOAL #5   Title  Patient will report that he has been able to resume dining out and visiting with his family and friends as these are activities that he enjoyed prior to onset of his symptoms.     Baseline  patient reports that he does not like to go out when the weather is cold and states he hopes to resume these activities once it is warmer    Time  8    Period  Weeks    Status  On-going            Plan - 05/06/17 1042    Clinical Impression Statement  Patient did better this date with balance exercises. Worked on cone tapping while standing on Airex pad and on firm surface and patient was able to better utilize hip strategies to regain balance with much less reliance on the use of UEs. Patient worked on step ups forwards and sidestepping on complian surfaces and again patient did much better with less reliance on UEs. Worked on progressing gym [employer] and cuing for breathing. Patient would benefit from continued PT services to further work towards goals and patient's goal of being able to return to going to a gym.     Rehab Potential  Good    Clinical Impairments Affecting Rehab Potential  positive indicators: motivated, family support  negative indicators: potential central and peripheral signs, chronicity, history of diplopia    PT Frequency  1x / week    PT Duration  8 weeks    PT Treatment/Interventions  Canalith Repostioning;Gait training;Stair training;Functional mobility training;Therapeutic activities;Patient/family education;Neuromuscular re-education;Balance training;Therapeutic exercise;Vestibular    PT Next Visit Plan  leg press, biodex tower, nustep, balance progressions- airex pad, cone tapping, alternate foot tapping; continue [employer] [facility] exercises to do strengthening exercises on machines in prep for him returning to the gym    PT Home Exercise Plan  VOR x 1 in standing with conflicting  background 1 minute reps, feet together and semi-tandem progressions with and without vertical and horizontal head turns and body turns, slow marching, sit to stand    Consulted and Agree with Plan of Care  Patient       Patient will benefit from skilled therapeutic intervention in order to improve the following deficits and impairments:  Decreased balance, Dizziness, Decreased mobility, Decreased strength, Difficulty walking  Visit Diagnosis: Dizziness and giddiness     Problem List Patient Active Problem List   Diagnosis Date Noted  . Nephrolithiasis 03/09/2017  . Atrial fibrillation ([place]) 06/16/2015  . Chronic diastolic CHF (congestive heart failure) ([place]) 06/16/2015  . Aortic root dilatation ([place]) 06/16/2015  . New onset atrial fibrillation ([place]) 05/31/2015  . Pneumonia due to other specified infectious organisms 05/31/2015  . Acute on chronic diastolic congestive heart failure ([place]) 05/31/2015  . Acute respiratory distress 05/31/2015  . Hyponatremia 05/27/2015  . Weakness generalized 05/27/2015  . HTN, goal below 140/80 05/27/2015  . Tremor 05/27/2015   Lady Deutscher PT, DPT [phone] Lady Deutscher 05/06/2017, 12:39 PM  [place] MAIN [facility] SERVICES [address] [place], [place], [address] Phone: [phone]   Fax:  [phone]  Name: Derek James MRN: [id number] Date of Birth: 1930/09/29

## 2017-05-22 ENCOUNTER — Encounter: Payer: Self-pay | Admitting: Urology

## 2017-05-22 ENCOUNTER — Ambulatory Visit (INDEPENDENT_AMBULATORY_CARE_PROVIDER_SITE_OTHER): Payer: Medicare Other | Admitting: Urology

## 2017-05-22 VITALS — BP 159/75 | HR 98 | Ht 64.0 in | Wt 137.0 lb

## 2017-05-22 DIAGNOSIS — N200 Calculus of kidney: Secondary | ICD-10-CM | POA: Diagnosis not present

## 2017-05-22 DIAGNOSIS — R350 Frequency of micturition: Secondary | ICD-10-CM | POA: Diagnosis not present

## 2017-05-22 LAB — URINALYSIS, COMPLETE
Bilirubin, UA: NEGATIVE
Glucose, UA: NEGATIVE
Ketones, UA: NEGATIVE
Leukocytes, UA: NEGATIVE
Nitrite, UA: NEGATIVE
Protein, UA: NEGATIVE
Specific Gravity, UA: 1.01 (ref 1.005–1.030)
Urobilinogen, Ur: 0.2 mg/dL (ref 0.2–1.0)
pH, UA: 6 (ref 5.0–7.5)

## 2017-05-22 LAB — BLADDER SCAN AMB NON-IMAGING: Scan Result: 119

## 2017-05-22 LAB — MICROSCOPIC EXAMINATION
Bacteria, UA: NONE SEEN
Epithelial Cells (non renal): NONE SEEN /HPF (ref 0–10)

## 2017-05-22 MED ORDER — FINASTERIDE 5 MG PO TABS: 5.0000 mg | ORAL_TABLET | Freq: Every day | ORAL | 11 refills | Status: DC

## 2017-05-22 NOTE — Progress Notes (Signed)
05/22/2017 9:24 AM   Derek James May 09, 1930 [id number]  Referring provider: Maryland Pink, MD [address] [facility] [place], [place] [address]  Chief Complaint  Patient presents with  . Urinary Frequency  . Urinary Urgency    HPI: 84 year old male presents stating he thinks he may have a prostate or kidney infection.  I saw him in January 2019 for follow-up of renal ultrasound.  He did have bilateral renal cyst and a 6 mm nonobstructing right renal calculus.  He complains of dull right low back pain which is usually worse with sitting.  He denies fever, chills, nausea or vomiting.   PMH: Past Medical History:  Diagnosis Date  . A-fib ([place])    a. new onset 05/2015; b. CHADS2VASC => 4 (CHF, HTN, age x 2)-->Xarelto initiated-->converted to sinus w/ pac's  . Cancer ([place])    skin ca  . Chronic diastolic CHF (congestive heart failure) ([place])    a. echo 05/2015: Ef 50-55%, rhythm Afib, moderately dilated aortic root, mild AI, moderate MR, LA normal in size, PASP 44 mmHg  . GERD (gastroesophageal reflux disease)   . Hypertension   . Rhinitis     Surgical History: Past Surgical History:  Procedure Laterality Date  . COLONOSCOPY WITH PROPOFOL N/A 09/05/2014   Procedure: COLONOSCOPY WITH PROPOFOL;  Surgeon: Manya Silvas, MD;  Location: [facility] ENDOSCOPY;  Service: Endoscopy;  Laterality: N/A;  . EYE SURGERY     retinal detatchment  . HERNIA REPAIR    . INNER EAR SURGERY      Home Medications:  Allergies as of 05/22/2017      Reactions   Dexlansoprazole Diarrhea   Escitalopram Diarrhea   Lansoprazole Diarrhea   Shrimp [shellfish Allergy]       Medication List        Accurate as of 05/22/17  9:24 AM. Always use your most recent med list.          ALIGN 4 MG Caps Take 4 mg by mouth daily.   azelastine 0.1 % nasal spray Commonly known as:  ASTELIN Place 1 spray into both nostrils 2 (two) times daily. Use in each nostril as directed     dexlansoprazole 60 MG capsule Commonly known as:  DEXILANT Take 60 mg by mouth daily.   esomeprazole 20 MG capsule Commonly known as:  NEXIUM Take 20 mg by mouth at bedtime.   loratadine 10 MG tablet Commonly known as:  CLARITIN Take 10 mg by mouth daily.   mirtazapine 15 MG tablet Commonly known as:  REMERON TAKE 1 TABLET BY MOUTH EVERY DAY AT NIGHT   rivaroxaban 20 MG Tabs tablet Commonly known as:  XARELTO Take 1 tablet (20 mg total) by mouth daily with supper.   tamsulosin 0.4 MG Caps capsule Commonly known as:  FLOMAX Take 0.4 mg by mouth daily.   VITAMIN B 12 PO Take by mouth.   Vitamin D3 3000 units Tabs Take by mouth.   zolpidem 10 MG tablet Commonly known as:  AMBIEN Take 5 mg by mouth at bedtime as needed for sleep.       Allergies:  Allergies  Allergen Reactions  . Dexlansoprazole Diarrhea  . Escitalopram Diarrhea  . Lansoprazole Diarrhea  . Shrimp [Shellfish Allergy]     Family History: Family History  Problem Relation Age of Onset  . Hypertension Mother   . Hypertension Father   . CAD Father   . Bladder Cancer Neg Hx   . Kidney cancer  Neg Hx   . Prostate cancer Neg Hx     Social History:  reports that he has never smoked. He has never used smokeless tobacco. He reports that he does not drink alcohol or use drugs.  ROS: UROLOGY Frequent Urination?: Yes Hard to postpone urination?: Yes Burning/pain with urination?: No Get up at night to urinate?: Yes Leakage of urine?: No Urine stream starts and stops?: No Trouble starting stream?: No Do you have to strain to urinate?: No Blood in urine?: No Urinary tract infection?: No Sexually transmitted disease?: No Injury to kidneys or bladder?: No Painful intercourse?: No Weak stream?: No Erection problems?: No Penile pain?: No  Gastrointestinal Nausea?: No Vomiting?: No Indigestion/heartburn?: No Diarrhea?: No Constipation?: No  Constitutional Fever: No Night sweats?:  No Weight loss?: No Fatigue?: No  Skin Skin rash/lesions?: No Itching?: No  Eyes Blurred vision?: No Double vision?: No  Ears/Nose/Throat Sore throat?: No Sinus problems?: No  Hematologic/Lymphatic Swollen glands?: No Easy bruising?: Yes  Cardiovascular Leg swelling?: No Chest pain?: No  Respiratory Cough?: No Shortness of breath?: No  Endocrine Excessive thirst?: No  Musculoskeletal Back pain?: Yes Joint pain?: No  Neurological Headaches?: Yes Dizziness?: No  Psychologic Depression?: No Anxiety?: No  Physical Exam: BP (!) 159/75 (BP Location: Left Arm, Patient Position: Sitting, Cuff Size: Normal)   Pulse 98   Ht 5\' 4"  (1.626 m)   Wt 137 lb (62.1 kg)   BMI 23.52 kg/m   Constitutional:  Alert and oriented, No acute distress. HEENT: [place] AT, moist mucus membranes.  Trachea midline, no masses. Cardiovascular: No clubbing, cyanosis, or edema. Respiratory: Normal respiratory effort, no increased work of breathing. GI: Abdomen is soft, nontender, nondistended, no abdominal masses GU: No CVA tenderness.  Prostate 45 g, smooth, nontender Lymph: No cervical or inguinal lymphadenopathy. Skin: No rashes, bruises or suspicious lesions. Neurologic: Grossly intact, no focal deficits, moving all 4 extremities. Psychiatric: Normal mood and affect.  Laboratory Data: Lab Results  Component Value Date   WBC 5.3 09/27/2016   HGB 14.9 09/27/2016   HCT 43.7 09/27/2016   MCV 90.5 09/27/2016   PLT 114 (L) 09/27/2016    Lab Results  Component Value Date   CREATININE 0.85 09/27/2016    Urinalysis Negative  Pertinent Imaging: N/A   Assessment & Plan:   Urinalysis is unremarkable.  His back pain is most likely musculoskeletal.  PVR by bladder scan was 119 mL.  He remains on tamsulosin.  Recommended a KUB to make sure his stone is not in an obstructing position.  Will also start on finasteride 5 mg daily.  Rx was sent to his pharmacy.  1. Nephrolithiasis  -  Urinalysis, Complete  2. Urinary frequency  - Bladder Scan (Post Void Residual) in office    [name], MD  [place] [address] [place], [place] [address] [phone]

## 2017-05-23 ENCOUNTER — Ambulatory Visit: Payer: Medicare Other | Admitting: Physical Therapy

## 2017-05-26 ENCOUNTER — Encounter: Payer: Self-pay | Admitting: Urology

## 2017-06-24 DIAGNOSIS — R143 Flatulence: Secondary | ICD-10-CM | POA: Insufficient documentation

## 2017-08-20 ENCOUNTER — Other Ambulatory Visit: Payer: Self-pay | Admitting: Family Medicine

## 2017-08-20 DIAGNOSIS — R141 Gas pain: Secondary | ICD-10-CM

## 2017-08-20 DIAGNOSIS — D696 Thrombocytopenia, unspecified: Secondary | ICD-10-CM

## 2017-08-27 ENCOUNTER — Ambulatory Visit
Admission: RE | Admit: 2017-08-27 | Discharge: 2017-08-27 | Disposition: A | Discharge status: Home / Self Care | Payer: Medicare Other | Source: Ambulatory Visit | Attending: Family Medicine | Admitting: Family Medicine

## 2017-08-27 DIAGNOSIS — R141 Gas pain: Secondary | ICD-10-CM | POA: Diagnosis present

## 2017-08-27 DIAGNOSIS — D696 Thrombocytopenia, unspecified: Secondary | ICD-10-CM | POA: Insufficient documentation

## 2017-09-03 ENCOUNTER — Encounter: Payer: Self-pay | Admitting: Urology

## 2017-09-03 ENCOUNTER — Ambulatory Visit (INDEPENDENT_AMBULATORY_CARE_PROVIDER_SITE_OTHER): Payer: Medicare Other | Admitting: Urology

## 2017-09-03 VITALS — BP 149/68 | HR 98 | Ht 64.0 in | Wt 133.7 lb

## 2017-09-03 DIAGNOSIS — R350 Frequency of micturition: Secondary | ICD-10-CM | POA: Diagnosis not present

## 2017-09-04 ENCOUNTER — Encounter: Payer: Self-pay | Admitting: Urology

## 2017-09-04 NOTE — Progress Notes (Signed)
09/03/2017 7:15 AM   Derek James Dec 17, 1930 [id number]  Referring provider: Maryland Pink, MD [address] [facility] [place], [place] [address]  Chief Complaint  Patient presents with  . Urinary Frequency    HPI: 84 year old male followed for a nonobstructing renal calculi and lower urinary tract symptoms.  He presents today with several questions, some not urologic related.  I last saw him on 05/22/2017 for a low back pain which was felt to be musculoskeletal.  A KUB showed a stable, lower pole calculus.  His voiding symptoms were worsening and he was started on finasteride.  He states he never took this medication because he was concerned after reading the package insert.  He has intermittent dizziness and was concerned about the statement of finasteride being associated with development of high-grade prostate cancer.  He states his urinary frequency and urgency is actually improved.  Denies dysuria or gross hematuria.  He did have an abdominal ultrasound performed on 08/29/2017 which showed stable renal cyst and a nonobstructing right renal calculus.   PMH: Past Medical History:  Diagnosis Date  . A-fib ([place])    a. new onset 05/2015; b. CHADS2VASC => 4 (CHF, HTN, age x 2)-->Xarelto initiated-->converted to sinus w/ pac's  . Cancer ([place])    skin ca  . Chronic diastolic CHF (congestive heart failure) ([place])    a. echo 05/2015: Ef 50-55%, rhythm Afib, moderately dilated aortic root, mild AI, moderate MR, LA normal in size, PASP 44 mmHg  . GERD (gastroesophageal reflux disease)   . Hypertension   . Rhinitis     Surgical History: Past Surgical History:  Procedure Laterality Date  . COLONOSCOPY WITH PROPOFOL N/A 09/05/2014   Procedure: COLONOSCOPY WITH PROPOFOL;  Surgeon: Manya Silvas, MD;  Location: [facility] ENDOSCOPY;  Service: Endoscopy;  Laterality: N/A;  . EYE SURGERY     retinal detatchment  . HERNIA REPAIR    . INNER EAR SURGERY      Home Medications:    Allergies as of 09/03/2017      Reactions   Dexlansoprazole Diarrhea   Escitalopram Diarrhea   Lansoprazole Diarrhea   Shrimp [shellfish Allergy]       Medication List        Accurate as of 09/03/17 11:59 PM. Always use your most recent med list.          ALIGN 4 MG Caps Take 4 mg by mouth daily.   azelastine 0.1 % nasal spray Commonly known as:  ASTELIN Place 1 spray into both nostrils 2 (two) times daily. Use in each nostril as directed   dexlansoprazole 60 MG capsule Commonly known as:  DEXILANT Take 60 mg by mouth daily.   esomeprazole 20 MG capsule Commonly known as:  NEXIUM Take 20 mg by mouth at bedtime.   loratadine 10 MG tablet Commonly known as:  CLARITIN Take 10 mg by mouth daily.   mirtazapine 15 MG tablet Commonly known as:  REMERON TAKE 1 TABLET BY MOUTH EVERY DAY AT NIGHT   montelukast 10 MG tablet Commonly known as:  SINGULAIR TAKE 1 TABLET BY MOUTH EVERY DAY AT NIGHT   rivaroxaban 20 MG Tabs tablet Commonly known as:  XARELTO Take 1 tablet (20 mg total) by mouth daily with supper.   tamsulosin 0.4 MG Caps capsule Commonly known as:  FLOMAX Take 0.4 mg by mouth daily.   VITAMIN B 12 PO Take by mouth.   Vitamin D3 3000 units Tabs Take by mouth.  zolpidem 10 MG tablet Commonly known as:  AMBIEN Take 5 mg by mouth at bedtime as needed for sleep.       Allergies:  Allergies  Allergen Reactions  . Dexlansoprazole Diarrhea  . Escitalopram Diarrhea  . Lansoprazole Diarrhea  . Shrimp [Shellfish Allergy]     Family History: Family History  Problem Relation Age of Onset  . Hypertension Mother   . Hypertension Father   . CAD Father   . Bladder Cancer Neg Hx   . Kidney cancer Neg Hx   . Prostate cancer Neg Hx     Social History:  reports that he has never smoked. He has never used smokeless tobacco. He reports that he does not drink alcohol or use drugs.  ROS: UROLOGY Frequent Urination?: Yes Hard to postpone urination?:  Yes Burning/pain with urination?: No Get up at night to urinate?: Yes Leakage of urine?: No Urine stream starts and stops?: No Trouble starting stream?: No Do you have to strain to urinate?: No Blood in urine?: No Urinary tract infection?: No Sexually transmitted disease?: No Injury to kidneys or bladder?: No Painful intercourse?: No Weak stream?: No Erection problems?: No Penile pain?: No  Gastrointestinal Nausea?: No Vomiting?: No Indigestion/heartburn?: No Diarrhea?: No Constipation?: No  Constitutional Fever: No Night sweats?: No Weight loss?: Yes Fatigue?: No  Skin Skin rash/lesions?: No Itching?: No  Eyes Blurred vision?: No Double vision?: No  Ears/Nose/Throat Sore throat?: No Sinus problems?: No  Hematologic/Lymphatic Swollen glands?: No Easy bruising?: Yes  Cardiovascular Leg swelling?: No Chest pain?: No  Respiratory Cough?: No Shortness of breath?: No  Endocrine Excessive thirst?: No  Musculoskeletal Back pain?: Yes Joint pain?: No  Neurological Headaches?: No Dizziness?: No  Psychologic Depression?: No Anxiety?: No  Physical Exam: BP (!) 149/68 (BP Location: Left Arm, Patient Position: Sitting, Cuff Size: Large)   Pulse 98   Ht 5\' 4"  (1.626 m)   Wt 133 lb 11.2 oz (60.6 kg)   BMI 22.95 kg/m   Constitutional:  Alert and oriented, No acute distress.    Assessment & Plan:   I discussed with [name] that dizziness is uncommon with finasteride.  He is on tamsulosin and was informed dizziness is much more common with this medication.  I also discussed that additional research regarding finasteride studies showed there was not a direct link of finasteride with high-grade prostate cancer.  His voiding symptoms have improved however and would recommend he not start this medication.  Greater than 50% of this 15-minute visit was spent counseling the patient.  Return in about 1 year (around 09/04/2018) for Derek James, KUB.  [name], [name] [address] [place], [place] [address] [phone]

## 2017-10-01 ENCOUNTER — Other Ambulatory Visit: Payer: Self-pay | Admitting: Rehabilitative and Restorative Service Providers"

## 2017-10-01 DIAGNOSIS — I77810 Thoracic aortic ectasia: Secondary | ICD-10-CM

## 2017-10-08 ENCOUNTER — Ambulatory Visit
Admission: RE | Admit: 2017-10-08 | Discharge: 2017-10-08 | Disposition: A | Discharge status: Home / Self Care | Payer: Medicare Other | Source: Ambulatory Visit | Attending: Rehabilitative and Restorative Service Providers" | Admitting: Rehabilitative and Restorative Service Providers"

## 2017-10-08 DIAGNOSIS — I77810 Thoracic aortic ectasia: Secondary | ICD-10-CM

## 2017-10-08 DIAGNOSIS — I712 Thoracic aortic aneurysm, without rupture: Secondary | ICD-10-CM | POA: Insufficient documentation

## 2017-10-08 DIAGNOSIS — I700 Atherosclerosis of aorta: Secondary | ICD-10-CM | POA: Insufficient documentation

## 2017-10-08 MED ORDER — IOPAMIDOL (ISOVUE-370) INJECTION 76%
75.0000 mL | Freq: Once | INTRAVENOUS | Status: AC | PRN
  Administered 2017-10-08: 75 mL via INTRAVENOUS

## 2017-11-28 ENCOUNTER — Telehealth: Payer: Self-pay

## 2017-11-28 NOTE — Telephone Encounter (Signed)
Left pt mess to call in regards to phone message stating he is having swelling in penis and legs.

## 2017-11-28 NOTE — Telephone Encounter (Signed)
Spoke with patient and he states that he saw his cardiologist on Monday due to the swelling in legs and was started on Lasix. Patient states his penis is now swelling. He is uncircumcised but is able to retract foreskin and is able to urinate fine. He was told to contact his Cardiologist and notify them about the swelling to see if medication needs to be adjusted. Patient was told that if he is unable to retract foreskin or is not able to urinate to contact our office.

## 2018-04-05 ENCOUNTER — Emergency Department: Payer: Medicare Other

## 2018-04-05 ENCOUNTER — Other Ambulatory Visit: Payer: Self-pay

## 2018-04-05 ENCOUNTER — Emergency Department
Admission: EM | Admit: 2018-04-05 | Discharge: 2018-04-06 | Disposition: A | Discharge status: Home / Self Care | Payer: Medicare Other | Attending: Emergency Medicine | Admitting: Emergency Medicine

## 2018-04-05 DIAGNOSIS — I110 Hypertensive heart disease with heart failure: Secondary | ICD-10-CM | POA: Diagnosis not present

## 2018-04-05 DIAGNOSIS — I5032 Chronic diastolic (congestive) heart failure: Secondary | ICD-10-CM | POA: Insufficient documentation

## 2018-04-05 DIAGNOSIS — R251 Tremor, unspecified: Secondary | ICD-10-CM | POA: Diagnosis present

## 2018-04-05 DIAGNOSIS — Z79899 Other long term (current) drug therapy: Secondary | ICD-10-CM | POA: Diagnosis not present

## 2018-04-05 LAB — BASIC METABOLIC PANEL
Anion gap: 8 (ref 5–15)
BUN: 25 mg/dL — ABNORMAL HIGH (ref 8–23)
CO2: 24 mmol/L (ref 22–32)
Calcium: 8.8 mg/dL — ABNORMAL LOW (ref 8.9–10.3)
Chloride: 102 mmol/L (ref 98–111)
Creatinine, Ser: 0.99 mg/dL (ref 0.61–1.24)
GFR calc Af Amer: >60 mL/min (ref >60)
GFR calc non Af Amer: >60 mL/min (ref >60)
Glucose, Bld: 111 mg/dL — ABNORMAL HIGH (ref 70–99)
POTASSIUM: 4.4 mmol/L (ref 3.5–5.1)
Sodium: 134 mmol/L — ABNORMAL LOW (ref 135–145)

## 2018-04-05 LAB — CBC WITH DIFFERENTIAL/PLATELET
Abs Immature Granulocytes: 0.02 10*3/uL (ref 0.00–0.07)
Basophils Absolute: 0 10*3/uL (ref 0.0–0.1)
Basophils Relative: 1 %
Eosinophils Absolute: 0.1 10*3/uL (ref 0.0–0.5)
Eosinophils Relative: 2 %
HEMATOCRIT: 43.2 % (ref 39.0–52.0)
HEMOGLOBIN: 14.2 g/dL (ref 13.0–17.0)
Immature Granulocytes: 0 %
LYMPHS ABS: 0.8 10*3/uL (ref 0.7–4.0)
Lymphocytes Relative: 15 %
MCH: 30 pg (ref 26.0–34.0)
MCHC: 32.9 g/dL (ref 30.0–36.0)
MCV: 91.3 fL (ref 80.0–100.0)
Monocytes Absolute: 0.7 10*3/uL (ref 0.1–1.0)
Monocytes Relative: 13 %
Neutro Abs: 3.7 10*3/uL (ref 1.7–7.7)
Neutrophils Relative %: 69 %
Platelets: 101 10*3/uL — ABNORMAL LOW (ref 150–400)
RBC: 4.73 MIL/uL (ref 4.22–5.81)
RDW: 14.3 % (ref 11.5–15.5)
WBC: 5.3 10*3/uL (ref 4.0–10.5)
nRBC: 0 % (ref 0.0–0.2)

## 2018-04-05 LAB — URINALYSIS, COMPLETE (UACMP) WITH MICROSCOPIC
Bacteria, UA: NONE SEEN
Bilirubin Urine: NEGATIVE
Glucose, UA: NEGATIVE mg/dL
KETONES UR: NEGATIVE mg/dL
Leukocytes,Ua: NEGATIVE
Nitrite: NEGATIVE
Protein, ur: NEGATIVE mg/dL
Specific Gravity, Urine: 1.017 (ref 1.005–1.030)
Squamous Epithelial / HPF: NONE SEEN (ref 0–5)
pH: 5 (ref 5.0–8.0)

## 2018-04-05 MED ORDER — SODIUM CHLORIDE 0.9 % IV BOLUS
1000.0000 mL | Freq: Once | INTRAVENOUS | Status: AC
  Administered 2018-04-05: 1000 mL via INTRAVENOUS

## 2018-04-05 NOTE — ED Notes (Signed)
Son out to the desk to talk to charge nurse; says he wants his father admitted to find some answers as to why he's been having his issues for about 8 months; son says "I need a break"; says he will take his dad to [place] if he's discharged because he wants answers

## 2018-04-05 NOTE — ED Notes (Signed)
Dr Joni Fears just came out of room after speaking with pt and son; son almost immediately came out of the room to continue speaking with MD

## 2018-04-05 NOTE — ED Triage Notes (Signed)
New sudden onset of tremors tonight. Hx of intermittent tremors but they have resolved. Hx of Afib. Declining appetite over the past month. Pt also reporting he has been exposed to black mold spores. Alert and oriented x 4, stroke screen was negative with EMS. Pt also reporting increased generalized weakness.

## 2018-04-06 NOTE — Discharge Instructions (Addendum)
Your labs and CT scan of the head today were unremarkable.  Please follow-up with your doctor tomorrow for continued monitoring of your symptoms.

## 2018-04-06 NOTE — ED Provider Notes (Signed)
[facility] Emergency Department Provider Note  ____________________________________________  Time seen: Approximately 12:20 AM  I have reviewed the triage vital signs and the nursing notes.   HISTORY  Chief Complaint Tremors    HPI Derek James is a 85 y.o. male with a history of atrial fibrillation, skin cancer, diastolic heart failure and hypertension who is brought to the ED by his son due to concerns about mold exposure.  He has developed a tremor over the last 2 to 3 days and they are worried that it might be because of mold toxicity.  Patient states that he has not been eating or drinking well for the past couple of days.  Denies any headache vision changes weakness or paresthesias.  No trauma.  No lateralizing weakness.  No loss of consciousness.  The tremor is episodic, not associated with loss of consciousness or urinary incontinence.  He does not have any seizure history.   He has been having extensive outpatient work-up recently with neurology, ENT, and ophthalmology.  Son also notes that his father does have some memory issues and difficulty with finances.   Past Medical History:  Diagnosis Date  . A-fib ([place])    a. new onset 05/2015; b. CHADS2VASC => 4 (CHF, HTN, age x 2)-->Xarelto initiated-->converted to sinus w/ pac's  . Cancer ([place])    skin ca  . Chronic diastolic CHF (congestive heart failure) ([place])    a. echo 05/2015: Ef 50-55%, rhythm Afib, moderately dilated aortic root, mild AI, moderate MR, LA normal in size, PASP 44 mmHg  . GERD (gastroesophageal reflux disease)   . Hypertension   . Rhinitis      Patient Active Problem List   Diagnosis Date Noted  . Excessive gas 06/24/2017  . Nephrolithiasis 03/09/2017  . Dizzy 10/24/2016  . Atrial fibrillation ([place]) 06/16/2015  . Chronic diastolic CHF (congestive heart failure) ([place]) 06/16/2015  . Aortic root dilatation ([place]) 06/16/2015  . New onset atrial fibrillation ([place]) 05/31/2015   . Pneumonia due to other specified infectious organisms 05/31/2015  . Acute on chronic diastolic congestive heart failure ([place]) 05/31/2015  . Acute respiratory distress 05/31/2015  . Hyponatremia 05/27/2015  . Weakness generalized 05/27/2015  . HTN, goal below 140/80 05/27/2015  . Tremor 05/27/2015  . Cholesteatoma of middle ear and mastoid, right 09/21/2014  . History of mastoidectomy 09/21/2014  . Mixed conductive and sensorineural hearing loss of left ear 09/21/2014  . Tympanic membrane perforation, marginal, right 09/21/2014  . Unilateral deafness, right 09/21/2014  . Allergic rhinitis 07/07/2013  . Benign essential hypertension 07/07/2013  . Esophageal reflux 07/07/2013  . Hypertrophy of prostate without urinary obstruction and other lower urinary tract symptoms (LUTS) 07/07/2013  . Cataract 08/06/2012  . PVD (posterior vitreous detachment), both eyes 08/06/2012  . Retinal detachment, old, total/subtotal 08/06/2012     Past Surgical History:  Procedure Laterality Date  . COLONOSCOPY WITH PROPOFOL N/A 09/05/2014   Procedure: COLONOSCOPY WITH PROPOFOL;  Surgeon: Manya Silvas, MD;  Location: [facility] ENDOSCOPY;  Service: Endoscopy;  Laterality: N/A;  . EYE SURGERY     retinal detatchment  . HERNIA REPAIR    . INNER EAR SURGERY       Prior to Admission medications   Medication Sig Start Date End Date Taking? Authorizing Provider  azelastine (ASTELIN) 0.1 % nasal spray Place 1 spray into both nostrils 2 (two) times daily. Use in each nostril as directed    [provider]  Cholecalciferol (VITAMIN D3) 3000 units TABS  Take by mouth.    [provider]  Cyanocobalamin (VITAMIN B 12 PO) Take by mouth.    [provider]  dexlansoprazole (DEXILANT) 60 MG capsule Take 60 mg by mouth daily.    [provider]  esomeprazole (NEXIUM) 20 MG capsule Take 20 mg by mouth at bedtime.     [provider]  loratadine (CLARITIN) 10 MG tablet Take  10 mg by mouth daily.    [provider]  mirtazapine (REMERON) 15 MG tablet TAKE 1 TABLET BY MOUTH EVERY DAY AT NIGHT 05/16/17   [provider]  montelukast (SINGULAIR) 10 MG tablet TAKE 1 TABLET BY MOUTH EVERY DAY AT NIGHT 06/27/17   [provider]  Probiotic Product (ALIGN) 4 MG CAPS Take 4 mg by mouth daily. 10/24/14   [provider]  rivaroxaban (XARELTO) 20 MG TABS tablet Take 1 tablet (20 mg total) by mouth daily with supper. 06/02/15   Hillary Bow, MD  tamsulosin (FLOMAX) 0.4 MG CAPS capsule Take 0.4 mg by mouth daily.     [provider]  zolpidem (AMBIEN) 10 MG tablet Take 5 mg by mouth at bedtime as needed for sleep.  05/22/15   [provider]     Allergies Dexlansoprazole; Escitalopram; Lansoprazole; and Shrimp [shellfish allergy]   Family History  Problem Relation Age of Onset  . Hypertension Mother   . Hypertension Father   . CAD Father   . Bladder Cancer Neg Hx   . Kidney cancer Neg Hx   . Prostate cancer Neg Hx     Social History Social History   Tobacco Use  . Smoking status: Never Smoker  . Smokeless tobacco: Never Used  Substance Use Topics  . Alcohol use: No  . Drug use: No    Review of Systems  Constitutional:   No fever or chills.  ENT:   No sore throat. No rhinorrhea. Cardiovascular:   No chest pain or syncope. Respiratory:   No dyspnea or cough. Gastrointestinal:   Negative for abdominal pain, vomiting and diarrhea.  Musculoskeletal:   Negative for focal pain or swelling All other systems reviewed and are negative except as documented above in ROS and HPI.  ____________________________________________   PHYSICAL EXAM:  VITAL SIGNS: ED Triage Vitals [04/05/18 1941]  Enc Vitals Group     BP 140/90     Pulse Rate 100     Resp (!) 22     Temp 98.3 F (36.8 C)     Temp Source Oral     SpO2 95 %     Weight 130 lb (59 kg)     Height 5\' 4"  (1.626 m)     Head Circumference      Peak  Flow      Pain Score 0     Pain Loc      Pain Edu?      Excl. in [place]?     Vital signs reviewed, nursing assessments reviewed.   Constitutional:   Alert and oriented. Non-toxic appearance. Eyes:   Conjunctivae are normal. EOMI. PERRL. ENT      Head:   Normocephalic and atraumatic.      Nose:   No congestion/rhinnorhea.       Mouth/Throat:   Dry mucous membranes, no pharyngeal erythema. No peritonsillar mass.       Neck:   No meningismus. Full ROM. Hematological/Lymphatic/Immunilogical:   No cervical lymphadenopathy. Cardiovascular:   Tachycardia heart rate 105. Symmetric bilateral radial and DP pulses.  No murmurs. Cap refill less than 2 seconds. Respiratory:   Normal respiratory effort without tachypnea/retractions. Breath sounds are clear and equal bilaterally. No wheezes/rales/rhonchi. Gastrointestinal:   Soft and nontender. Non distended. There is no CVA tenderness.  No rebound, rigidity, or guarding.  Musculoskeletal:   Normal range of motion in all extremities. No joint effusions.  No lower extremity tenderness.  No edema. Neurologic:   Normal speech and language.  Intermittent peripheral tremor bilaterally.  Not consistent with rigors Motor grossly intact. No acute focal neurologic deficits are appreciated.  Skin:    Skin is warm, dry and intact. No rash noted.  No petechiae, purpura, or bullae.  ____________________________________________    LABS (pertinent positives/negatives) (all labs ordered are listed, but only abnormal results are displayed) Labs Reviewed  BASIC METABOLIC PANEL - Abnormal; Notable for the following components:      Result Value   Sodium 134 (*)    Glucose, Bld 111 (*)    BUN 25 (*)    Calcium 8.8 (*)    All other components within normal limits  CBC WITH DIFFERENTIAL/PLATELET - Abnormal; Notable for the following components:   Platelets 101 (*)    All other components within normal limits  URINALYSIS, COMPLETE (UACMP) WITH MICROSCOPIC -  Abnormal; Notable for the following components:   Color, Urine YELLOW (*)    APPearance CLEAR (*)    Hgb urine dipstick SMALL (*)    All other components within normal limits  URINE CULTURE   ____________________________________________   EKG  Interpreted by me Sinus tachycardia rate 105, normal axis and intervals.  Poor R wave progression, left ventricular hypertrophy, no acute ischemic changes.  ____________________________________________    RADIOLOGY  Ct Head Wo Contrast  Result Date: 04/05/2018 CLINICAL DATA:  Sudden onset new tremors tonight. Altered level of consciousness. Increasing generalized weakness. EXAM: CT HEAD WITHOUT CONTRAST TECHNIQUE: Contiguous axial images were obtained from the base of the skull through the vertex without intravenous contrast. COMPARISON:  MRI brain 10/23/2016 FINDINGS: Brain: Diffuse cerebral atrophy. Mild ventricular dilatation consistent with central atrophy. Low-attenuation changes in the deep white matter consistent with small vessel ischemia. Old appearing lacunar infarcts in the left thalamus. No mass effect or midline shift. No abnormal extra-axial fluid collections. Gray-white matter junctions are distinct. Basal cisterns are not effaced. No acute intracranial hemorrhage. Vascular: Intracranial arterial vascular calcifications are present. Skull: Calvarium appears intact. Sinuses/Orbits: Paranasal sinuses are clear. Postoperative changes in the mastoids bilaterally. No effusions. Other: None. IMPRESSION: No acute intracranial abnormalities. Chronic atrophy and small vessel ischemic changes. Electronically Signed   By: Lucienne Capers M.D.   On: 04/05/2018 23:56    ____________________________________________   PROCEDURES Procedures  ____________________________________________    CLINICAL IMPRESSION / ASSESSMENT AND PLAN / ED COURSE  Medications ordered in the ED: Medications  sodium chloride 0.9 % bolus 1,000 mL (0 mLs  Intravenous Stopped 04/05/18 2128)  sodium chloride 0.9 % bolus 1,000 mL (0 mLs Intravenous Stopped 04/05/18 2345)    Pertinent labs & imaging results that were available during my care of the patient were reviewed by me and considered in my medical decision making (see chart for details).    Patient presents with shaking episodes which appear to be tremor, possibly related to dementia or parkinsonism versus dehydration, electrolyte abnormality, or intracranial tumor.  CT scan of the head was unremarkable as are labs.  Patient did have some orthostatic changes in his heart rate which improved with IV fluids.  This is all  consistent with some dehydration but overall patient is nontoxic, medically stable to follow-up with primary care and neurology.  Had a long conversation with the patient and the son about these findings.  The son is notably apprehensive about the nonspecific nature of the symptoms to which I tried to reassure him while also encouraging him to continue outpatient follow-up.  Clinical Course as of Apr 06 18  Sun Apr 05, 2018  2228 Labs are all normal.  Stable for discharge home and follow-up with primary care after IV fluids are completed.  Presentation is consistent with dehydration due to poor oral intake.   [PS]    Clinical Course User Index [PS] Carrie Mew, MD     ____________________________________________   FINAL CLINICAL IMPRESSION(S) / ED DIAGNOSES    Final diagnoses:  Tremor     ED Discharge Orders    None      Portions of this note were generated with dragon dictation software. Dictation errors may occur despite best attempts at proofreading.   Carrie Mew, MD 04/06/18 Laureen Abrahams

## 2018-04-07 LAB — URINE CULTURE

## 2018-04-26 ENCOUNTER — Emergency Department: Payer: Medicare Other

## 2018-04-26 ENCOUNTER — Inpatient Hospital Stay
Admission: EM | Admit: 2018-04-26 | Discharge: 2018-05-13 | DRG: 291 | Disposition: E | Payer: Medicare Other | Attending: Internal Medicine | Admitting: Internal Medicine

## 2018-04-26 ENCOUNTER — Inpatient Hospital Stay
Admit: 2018-04-26 | Discharge: 2018-04-26 | Disposition: A | Discharge status: Home / Self Care | Payer: Medicare Other | Attending: Family Medicine | Admitting: Family Medicine

## 2018-04-26 ENCOUNTER — Other Ambulatory Visit: Payer: Self-pay

## 2018-04-26 DIAGNOSIS — I5033 Acute on chronic diastolic (congestive) heart failure: Secondary | ICD-10-CM

## 2018-04-26 DIAGNOSIS — Z85828 Personal history of other malignant neoplasm of skin: Secondary | ICD-10-CM | POA: Diagnosis not present

## 2018-04-26 DIAGNOSIS — I313 Pericardial effusion (noninflammatory): Secondary | ICD-10-CM | POA: Diagnosis present

## 2018-04-26 DIAGNOSIS — I4891 Unspecified atrial fibrillation: Secondary | ICD-10-CM

## 2018-04-26 DIAGNOSIS — I248 Other forms of acute ischemic heart disease: Secondary | ICD-10-CM | POA: Diagnosis present

## 2018-04-26 DIAGNOSIS — D689 Coagulation defect, unspecified: Secondary | ICD-10-CM

## 2018-04-26 DIAGNOSIS — J189 Pneumonia, unspecified organism: Secondary | ICD-10-CM | POA: Diagnosis present

## 2018-04-26 DIAGNOSIS — K761 Chronic passive congestion of liver: Secondary | ICD-10-CM | POA: Diagnosis present

## 2018-04-26 DIAGNOSIS — J9690 Respiratory failure, unspecified, unspecified whether with hypoxia or hypercapnia: Secondary | ICD-10-CM

## 2018-04-26 DIAGNOSIS — R7989 Other specified abnormal findings of blood chemistry: Secondary | ICD-10-CM

## 2018-04-26 DIAGNOSIS — I5043 Acute on chronic combined systolic (congestive) and diastolic (congestive) heart failure: Secondary | ICD-10-CM | POA: Diagnosis present

## 2018-04-26 DIAGNOSIS — I2510 Atherosclerotic heart disease of native coronary artery without angina pectoris: Secondary | ICD-10-CM | POA: Diagnosis present

## 2018-04-26 DIAGNOSIS — R230 Cyanosis: Secondary | ICD-10-CM | POA: Diagnosis not present

## 2018-04-26 DIAGNOSIS — Z8249 Family history of ischemic heart disease and other diseases of the circulatory system: Secondary | ICD-10-CM

## 2018-04-26 DIAGNOSIS — I071 Rheumatic tricuspid insufficiency: Secondary | ICD-10-CM | POA: Diagnosis present

## 2018-04-26 DIAGNOSIS — R55 Syncope and collapse: Secondary | ICD-10-CM | POA: Diagnosis present

## 2018-04-26 DIAGNOSIS — Z7982 Long term (current) use of aspirin: Secondary | ICD-10-CM

## 2018-04-26 DIAGNOSIS — R791 Abnormal coagulation profile: Secondary | ICD-10-CM | POA: Diagnosis present

## 2018-04-26 DIAGNOSIS — J9601 Acute respiratory failure with hypoxia: Secondary | ICD-10-CM | POA: Diagnosis present

## 2018-04-26 DIAGNOSIS — J810 Acute pulmonary edema: Secondary | ICD-10-CM | POA: Diagnosis not present

## 2018-04-26 DIAGNOSIS — I110 Hypertensive heart disease with heart failure: Secondary | ICD-10-CM | POA: Diagnosis present

## 2018-04-26 DIAGNOSIS — E782 Mixed hyperlipidemia: Secondary | ICD-10-CM | POA: Diagnosis present

## 2018-04-26 DIAGNOSIS — M7989 Other specified soft tissue disorders: Secondary | ICD-10-CM | POA: Diagnosis present

## 2018-04-26 DIAGNOSIS — Y92003 Bedroom of unspecified non-institutional (private) residence as the place of occurrence of the external cause: Secondary | ICD-10-CM | POA: Diagnosis not present

## 2018-04-26 DIAGNOSIS — I712 Thoracic aortic aneurysm, without rupture: Secondary | ICD-10-CM | POA: Diagnosis present

## 2018-04-26 DIAGNOSIS — D696 Thrombocytopenia, unspecified: Secondary | ICD-10-CM | POA: Diagnosis present

## 2018-04-26 DIAGNOSIS — R778 Other specified abnormalities of plasma proteins: Secondary | ICD-10-CM

## 2018-04-26 DIAGNOSIS — J470 Bronchiectasis with acute lower respiratory infection: Secondary | ICD-10-CM | POA: Diagnosis present

## 2018-04-26 DIAGNOSIS — I469 Cardiac arrest, cause unspecified: Secondary | ICD-10-CM | POA: Diagnosis not present

## 2018-04-26 DIAGNOSIS — S40011A Contusion of right shoulder, initial encounter: Secondary | ICD-10-CM | POA: Diagnosis present

## 2018-04-26 DIAGNOSIS — I480 Paroxysmal atrial fibrillation: Secondary | ICD-10-CM | POA: Diagnosis present

## 2018-04-26 DIAGNOSIS — W1839XA Other fall on same level, initial encounter: Secondary | ICD-10-CM | POA: Diagnosis present

## 2018-04-26 DIAGNOSIS — Z79899 Other long term (current) drug therapy: Secondary | ICD-10-CM | POA: Diagnosis not present

## 2018-04-26 DIAGNOSIS — I420 Dilated cardiomyopathy: Secondary | ICD-10-CM | POA: Diagnosis present

## 2018-04-26 DIAGNOSIS — I509 Heart failure, unspecified: Secondary | ICD-10-CM

## 2018-04-26 DIAGNOSIS — K219 Gastro-esophageal reflux disease without esophagitis: Secondary | ICD-10-CM | POA: Diagnosis present

## 2018-04-26 DIAGNOSIS — Z91013 Allergy to seafood: Secondary | ICD-10-CM

## 2018-04-26 DIAGNOSIS — Z7901 Long term (current) use of anticoagulants: Secondary | ICD-10-CM

## 2018-04-26 DIAGNOSIS — Z888 Allergy status to other drugs, medicaments and biological substances status: Secondary | ICD-10-CM

## 2018-04-26 DIAGNOSIS — T45515A Adverse effect of anticoagulants, initial encounter: Secondary | ICD-10-CM | POA: Diagnosis present

## 2018-04-26 DIAGNOSIS — S1093XA Contusion of unspecified part of neck, initial encounter: Secondary | ICD-10-CM | POA: Diagnosis present

## 2018-04-26 LAB — CBC WITH DIFFERENTIAL/PLATELET
Abs Immature Granulocytes: 0.04 10*3/uL (ref 0.00–0.07)
Abs Immature Granulocytes: 0.04 10*3/uL (ref 0.00–0.07)
BASOS PCT: 0 %
Basophils Absolute: 0 10*3/uL (ref 0.0–0.1)
Basophils Absolute: 0 10*3/uL (ref 0.0–0.1)
Basophils Relative: 0 %
EOS ABS: 0 10*3/uL (ref 0.0–0.5)
EOS PCT: 2 %
Eosinophils Absolute: 0.2 10*3/uL (ref 0.0–0.5)
Eosinophils Relative: 0 %
HCT: 37.8 % — ABNORMAL LOW (ref 39.0–52.0)
HEMATOCRIT: 37.5 % — AB (ref 39.0–52.0)
Hemoglobin: 12.5 g/dL — ABNORMAL LOW (ref 13.0–17.0)
Hemoglobin: 12.3 g/dL — ABNORMAL LOW (ref 13.0–17.0)
Immature Granulocytes: 1 %
Immature Granulocytes: 0 %
Lymphocytes Relative: 5 %
Lymphocytes Relative: 6 %
Lymphs Abs: 0.4 10*3/uL — ABNORMAL LOW (ref 0.7–4.0)
Lymphs Abs: 0.6 10*3/uL — ABNORMAL LOW (ref 0.7–4.0)
MCH: 30.1 pg (ref 26.0–34.0)
MCH: 30.4 pg (ref 26.0–34.0)
MCHC: 33.1 g/dL (ref 30.0–36.0)
MCHC: 32.8 g/dL (ref 30.0–36.0)
MCV: 91.1 fL (ref 80.0–100.0)
MCV: 92.6 fL (ref 80.0–100.0)
MONO ABS: 1.1 10*3/uL — AB (ref 0.1–1.0)
MONOS PCT: 11 %
Monocytes Absolute: 1 10*3/uL (ref 0.1–1.0)
Monocytes Relative: 12 %
Neutro Abs: 7.2 10*3/uL (ref 1.7–7.7)
Neutro Abs: 7.8 10*3/uL — ABNORMAL HIGH (ref 1.7–7.7)
Neutrophils Relative %: 83 %
Neutrophils Relative %: 80 %
PLATELETS: 130 10*3/uL — AB (ref 150–400)
Platelets: 128 10*3/uL — ABNORMAL LOW (ref 150–400)
RBC: 4.15 MIL/uL — ABNORMAL LOW (ref 4.22–5.81)
RBC: 4.05 MIL/uL — ABNORMAL LOW (ref 4.22–5.81)
RDW: 14.2 % (ref 11.5–15.5)
RDW: 14.2 % (ref 11.5–15.5)
WBC: 8.6 10*3/uL (ref 4.0–10.5)
WBC: 9.8 10*3/uL (ref 4.0–10.5)
nRBC: 0 % (ref 0.0–0.2)
nRBC: 0 % (ref 0.0–0.2)

## 2018-04-26 LAB — COMPREHENSIVE METABOLIC PANEL
ALK PHOS: 88 U/L (ref 38–126)
ALT: 123 U/L — ABNORMAL HIGH (ref 0–44)
AST: 145 U/L — ABNORMAL HIGH (ref 15–41)
Albumin: 3.6 g/dL (ref 3.5–5.0)
Anion gap: 10 (ref 5–15)
BUN: 44 mg/dL — ABNORMAL HIGH (ref 8–23)
CALCIUM: 8.3 mg/dL — AB (ref 8.9–10.3)
CO2: 21 mmol/L — ABNORMAL LOW (ref 22–32)
Chloride: 102 mmol/L (ref 98–111)
Creatinine, Ser: 1.11 mg/dL (ref 0.61–1.24)
GFR calc Af Amer: >60 mL/min (ref >60)
GFR calc non Af Amer: >60 mL/min (ref >60)
Glucose, Bld: 136 mg/dL — ABNORMAL HIGH (ref 70–99)
Potassium: 4.4 mmol/L (ref 3.5–5.1)
Sodium: 133 mmol/L — ABNORMAL LOW (ref 135–145)
Total Bilirubin: 2.1 mg/dL — ABNORMAL HIGH (ref 0.3–1.2)
Total Protein: 6.5 g/dL (ref 6.5–8.1)

## 2018-04-26 LAB — PROTIME-INR
INR: 7.9 (ref 0.8–1.2)
Prothrombin Time: 65.3 seconds — ABNORMAL HIGH (ref 11.4–15.2)

## 2018-04-26 LAB — MAGNESIUM: Magnesium: 2 mg/dL (ref 1.7–2.4)

## 2018-04-26 LAB — RESPIRATORY PANEL BY PCR

## 2018-04-26 LAB — INFLUENZA PANEL BY PCR (TYPE A & B)
Influenza A By PCR: NEGATIVE
Influenza B By PCR: NEGATIVE

## 2018-04-26 LAB — CK: CK TOTAL: 65 U/L (ref 49–397)

## 2018-04-26 LAB — ECHOCARDIOGRAM COMPLETE
Height: 64 in
Weight: 2077.62 oz

## 2018-04-26 LAB — LACTIC ACID, PLASMA
Lactic Acid, Venous: 2.6 mmol/L (ref 0.5–1.9)
Lactic Acid, Venous: 2 mmol/L (ref 0.5–1.9)

## 2018-04-26 LAB — TECHNOLOGIST SMEAR REVIEW

## 2018-04-26 LAB — APTT: aPTT: 61 seconds — ABNORMAL HIGH (ref 24–36)

## 2018-04-26 LAB — MRSA PCR SCREENING: MRSA by PCR: NEGATIVE

## 2018-04-26 LAB — BRAIN NATRIURETIC PEPTIDE: B Natriuretic Peptide: 3090 pg/mL — ABNORMAL HIGH (ref 0.0–100.0)

## 2018-04-26 LAB — TROPONIN I: Troponin I: 0.19 ng/mL (ref <0.03)

## 2018-04-26 LAB — PROCALCITONIN: Procalcitonin: 0.24 ng/mL

## 2018-04-26 LAB — GLUCOSE, CAPILLARY: GLUCOSE-CAPILLARY: 126 mg/dL — AB (ref 70–99)

## 2018-04-26 MED ORDER — VITAMIN B-12 100 MCG PO TABS
100.0000 ug | ORAL_TABLET | Freq: Every day | ORAL | Status: DC
  Administered 2018-04-26: 100 ug via ORAL
  Filled 2018-04-26: qty 1

## 2018-04-26 MED ORDER — SODIUM CHLORIDE 0.9 % IV SOLN: 250.0000 mL | INTRAVENOUS | Status: DC | PRN

## 2018-04-26 MED ORDER — SODIUM CHLORIDE 0.9% FLUSH: 3.0000 mL | Freq: Two times a day (BID) | INTRAVENOUS | Status: DC

## 2018-04-26 MED ORDER — METOPROLOL TARTRATE 50 MG PO TABS
50.0000 mg | ORAL_TABLET | Freq: Two times a day (BID) | ORAL | Status: DC
  Administered 2018-04-26: 50 mg via ORAL
  Filled 2018-04-26: qty 1

## 2018-04-26 MED ORDER — AMIODARONE LOAD VIA INFUSION
150.0000 mg | Freq: Once | INTRAVENOUS | Status: AC
  Administered 2018-04-26: 150 mg via INTRAVENOUS
  Filled 2018-04-26: qty 83.34

## 2018-04-26 MED ORDER — TAMSULOSIN HCL 0.4 MG PO CAPS
0.4000 mg | ORAL_CAPSULE | Freq: Every day | ORAL | Status: DC
  Administered 2018-04-26: 0.4 mg via ORAL
  Filled 2018-04-26: qty 1

## 2018-04-26 MED ORDER — VANCOMYCIN HCL 10 G IV SOLR
1500.0000 mg | Freq: Once | INTRAVENOUS | Status: AC
  Administered 2018-04-26: 1500 mg via INTRAVENOUS
  Filled 2018-04-26: qty 1500

## 2018-04-26 MED ORDER — AZELASTINE HCL 0.1 % NA SOLN
1.0000 | Freq: Two times a day (BID) | NASAL | Status: DC
  Administered 2018-04-26: 1 via NASAL
  Filled 2018-04-26: qty 30

## 2018-04-26 MED ORDER — METOPROLOL TARTRATE 5 MG/5ML IV SOLN
5.0000 mg | INTRAVENOUS | Status: DC | PRN
  Administered 2018-04-26: 5 mg via INTRAVENOUS
  Filled 2018-04-26: qty 5

## 2018-04-26 MED ORDER — SODIUM CHLORIDE 0.9 % IV SOLN
2.0000 g | Freq: Once | INTRAVENOUS | Status: AC
  Administered 2018-04-26: 2 g via INTRAVENOUS
  Filled 2018-04-26: qty 2

## 2018-04-26 MED ORDER — SODIUM CHLORIDE 0.9% FLUSH: 3.0000 mL | INTRAVENOUS | Status: DC | PRN

## 2018-04-26 MED ORDER — MIRTAZAPINE 15 MG PO TABS: 15.0000 mg | ORAL_TABLET | Freq: Every day | ORAL | Status: DC

## 2018-04-26 MED ORDER — VANCOMYCIN HCL IN DEXTROSE 750-5 MG/150ML-% IV SOLN: 750.0000 mg | INTRAVENOUS | Status: DC

## 2018-04-26 MED ORDER — ONDANSETRON HCL 4 MG/2ML IJ SOLN: 4.0000 mg | Freq: Four times a day (QID) | INTRAMUSCULAR | Status: DC | PRN

## 2018-04-26 MED ORDER — ACETAMINOPHEN 325 MG PO TABS: 650.0000 mg | ORAL_TABLET | ORAL | Status: DC | PRN

## 2018-04-26 MED ORDER — PIPERACILLIN-TAZOBACTAM 3.375 G IVPB
3.3750 g | Freq: Three times a day (TID) | INTRAVENOUS | Status: DC
  Administered 2018-04-26: 3.375 g via INTRAVENOUS
  Filled 2018-04-26: qty 50

## 2018-04-26 MED ORDER — IPRATROPIUM-ALBUTEROL 0.5-2.5 (3) MG/3ML IN SOLN: 3.0000 mL | RESPIRATORY_TRACT | Status: DC | PRN

## 2018-04-26 MED ORDER — MAGNESIUM SULFATE 2 GM/50ML IV SOLN: 2.0000 g | Freq: Once | INTRAVENOUS | Status: DC

## 2018-04-26 MED ORDER — VANCOMYCIN HCL IN DEXTROSE 1-5 GM/200ML-% IV SOLN: 1000.0000 mg | Freq: Once | INTRAVENOUS | Status: DC

## 2018-04-26 MED ORDER — PANTOPRAZOLE SODIUM 40 MG PO TBEC
40.0000 mg | DELAYED_RELEASE_TABLET | Freq: Every day | ORAL | Status: DC
  Administered 2018-04-26: 40 mg via ORAL
  Filled 2018-04-26: qty 1

## 2018-04-26 MED ORDER — ZOLPIDEM TARTRATE 5 MG PO TABS: 5.0000 mg | ORAL_TABLET | Freq: Every evening | ORAL | Status: DC | PRN

## 2018-04-26 MED ORDER — FUROSEMIDE 10 MG/ML IJ SOLN
40.0000 mg | Freq: Two times a day (BID) | INTRAMUSCULAR | Status: DC
  Administered 2018-04-26: 40 mg via INTRAVENOUS
  Filled 2018-04-26: qty 4

## 2018-04-26 MED ORDER — AMIODARONE HCL IN DEXTROSE 360-4.14 MG/200ML-% IV SOLN: 30.0000 mg/h | INTRAVENOUS | Status: DC

## 2018-04-26 MED ORDER — VITAMIN D 25 MCG (1000 UNIT) PO TABS
3000.0000 [IU] | ORAL_TABLET | Freq: Every day | ORAL | Status: DC
  Administered 2018-04-26: 3000 [IU] via ORAL
  Filled 2018-04-26: qty 3

## 2018-04-26 MED ORDER — PIPERACILLIN-TAZOBACTAM 3.375 G IVPB 30 MIN: 3.3750 g | Freq: Four times a day (QID) | INTRAVENOUS | Status: DC

## 2018-04-26 MED ORDER — GUAIFENESIN ER 600 MG PO TB12
600.0000 mg | ORAL_TABLET | Freq: Two times a day (BID) | ORAL | Status: DC
  Administered 2018-04-26: 600 mg via ORAL
  Filled 2018-04-26: qty 1

## 2018-04-26 MED ORDER — RISAQUAD PO CAPS
1.0000 | ORAL_CAPSULE | Freq: Every day | ORAL | Status: DC
  Administered 2018-04-26: 1 via ORAL
  Filled 2018-04-26: qty 1

## 2018-04-26 MED ORDER — AMIODARONE HCL IN DEXTROSE 360-4.14 MG/200ML-% IV SOLN
60.0000 mg/h | INTRAVENOUS | Status: DC
  Filled 2018-04-26: qty 200

## 2018-04-26 MED ORDER — MONTELUKAST SODIUM 10 MG PO TABS
10.0000 mg | ORAL_TABLET | Freq: Every day | ORAL | Status: DC
  Administered 2018-04-26: 10 mg via ORAL
  Filled 2018-04-26: qty 1

## 2018-04-26 MED ORDER — LORATADINE 10 MG PO TABS
10.0000 mg | ORAL_TABLET | Freq: Every day | ORAL | Status: DC
  Administered 2018-04-26: 10 mg via ORAL
  Filled 2018-04-26: qty 1

## 2018-04-27 ENCOUNTER — Telehealth: Payer: Self-pay

## 2018-04-27 NOTE — Telephone Encounter (Signed)
Death Certificate received. Placed in DK box for completion. 

## 2018-04-28 NOTE — Telephone Encounter (Signed)
Death Certificate has been signed and placed up front for pickup. Derek James is aware.

## 2018-05-01 LAB — CULTURE, BLOOD (ROUTINE X 2)
Culture: NO GROWTH
Culture: NO GROWTH
Special Requests: ADEQUATE
Special Requests: ADEQUATE

## 2018-05-04 ENCOUNTER — Ambulatory Visit: Payer: Medicare Other | Admitting: Family

## 2018-05-13 NOTE — ED Provider Notes (Signed)
[facility] Emergency Department Provider Note  ____________________________________________   First MD Initiated Contact with Patient 2018/05/10 [phone]     (approximate)  I have reviewed the triage vital signs and the nursing notes.   HISTORY  Chief Complaint Fall    HPI Derek James is a 85 y.o. male with extensive chronic medical history but which notably includes paroxysmal atrial fibrillation on Xarelto but on no beta-blockers or calcium channel blockers.  He presents by EMS for evaluation after an apparent syncopal episode.  Reportedly he was taking off his pants to get ready for bed and then ended up on the floor.  He does not know how this happened.  His son tried to help him up but reports that his father seem to be not acting right.  When the patient arrived he denied chest pain or shortness of breath as well as abdominal pain.  He did not strike his head but said that he has some pain in his neck but this is been steady for a couple of weeks since his last fall for which she still has some bruising on the right side of his body.  He has no pain in his arms or his legs.  The episode was acute in onset and severe and he does not remember what happened.  Later, after his son arrived, his son reported that the patient had not been acting right over the course of the day but without any specific symptoms.  He reportedly has had a little bit of a cough over the last couple of days and may be some increased shortness of breath with exertion but his son was not certain about any of the symptoms.  The patient has had some increased swelling in his legs recently and has taken 20 mg of Lasix daily as needed but not consistently.         Past Medical History:  Diagnosis Date   A-fib [facility])    a. new onset 05/2015; b. CHADS2VASC => 4 (CHF, HTN, age x 2)-->Xarelto initiated-->converted to sinus w/ pac's   Cancer (HCC)    skin ca   Chronic diastolic CHF  (congestive heart failure) ([place])    a. echo 05/2015: Ef 50-55%, rhythm Afib, moderately dilated aortic root, mild AI, moderate MR, LA normal in size, PASP 44 mmHg   GERD (gastroesophageal reflux disease)    Hypertension    Rhinitis     Patient Active Problem List   Diagnosis Date Noted   Acute on chronic diastolic CHF (congestive heart failure) ([place]) 05/10/2018   Excessive gas 06/24/2017   Nephrolithiasis 03/09/2017   Dizzy 10/24/2016   Atrial fibrillation ([place]) 06/16/2015   Chronic diastolic CHF (congestive heart failure) ([place]) 06/16/2015   Aortic root dilatation ([place]) 06/16/2015   New onset atrial fibrillation ([place]) 05/31/2015   Pneumonia due to other specified infectious organisms 05/31/2015   Acute on chronic diastolic congestive heart failure ([place]) 05/31/2015   Acute respiratory distress 05/31/2015   Hyponatremia 05/27/2015   Weakness generalized 05/27/2015   HTN, goal below 140/80 05/27/2015   Tremor 05/27/2015   Cholesteatoma of middle ear and mastoid, right 09/21/2014   History of mastoidectomy 09/21/2014   Mixed conductive and sensorineural hearing loss of left ear 09/21/2014   Tympanic membrane perforation, marginal, right 09/21/2014   Unilateral deafness, right 09/21/2014   Allergic rhinitis 07/07/2013   Benign essential hypertension 07/07/2013   Esophageal reflux 07/07/2013   Hypertrophy of prostate without urinary obstruction and other  lower urinary tract symptoms (LUTS) 07/07/2013   Cataract 08/06/2012   PVD (posterior vitreous detachment), both eyes 08/06/2012   Retinal detachment, old, total/subtotal 08/06/2012    Past Surgical History:  Procedure Laterality Date   COLONOSCOPY WITH PROPOFOL N/A 09/05/2014   Procedure: COLONOSCOPY WITH PROPOFOL;  Surgeon: Manya Silvas, MD;  Location: [place];  Service: Endoscopy;  Laterality: N/A;   EYE SURGERY     retinal detatchment   HERNIA REPAIR     INNER EAR SURGERY       Prior to Admission medications   Medication Sig Start Date End Date Taking? Authorizing Provider  azelastine (ASTELIN) 0.1 % nasal spray Place 1 spray into both nostrils 2 (two) times daily. Use in each nostril as directed    [provider]  Cholecalciferol (VITAMIN D3) 3000 units TABS Take by mouth.    [provider]  Cyanocobalamin (VITAMIN B 12 PO) Take by mouth.    [provider]  dexlansoprazole (DEXILANT) 60 MG capsule Take 60 mg by mouth daily.    [provider]  esomeprazole (NEXIUM) 20 MG capsule Take 20 mg by mouth at bedtime.     [provider]  loratadine (CLARITIN) 10 MG tablet Take 10 mg by mouth daily.    [provider]  mirtazapine (REMERON) 15 MG tablet TAKE 1 TABLET BY MOUTH EVERY DAY AT NIGHT 05/16/17   [provider]  montelukast (SINGULAIR) 10 MG tablet TAKE 1 TABLET BY MOUTH EVERY DAY AT NIGHT 06/27/17   [provider]  Probiotic Product (ALIGN) 4 MG CAPS Take 4 mg by mouth daily. 10/24/14   [provider]  rivaroxaban (XARELTO) 20 MG TABS tablet Take 1 tablet (20 mg total) by mouth daily with supper. 06/02/15   Hillary Bow, MD  tamsulosin (FLOMAX) 0.4 MG CAPS capsule Take 0.4 mg by mouth daily.     [provider]  zolpidem (AMBIEN) 10 MG tablet Take 5 mg by mouth at bedtime as needed for sleep.  05/22/15   [provider]    Allergies Dexlansoprazole; Escitalopram; Lansoprazole; and Shrimp [shellfish allergy]  Family History  Problem Relation Age of Onset   Hypertension Mother    Hypertension Father    CAD Father    Bladder Cancer Neg Hx    Kidney cancer Neg Hx    Prostate cancer Neg Hx     Social History Social History   Tobacco Use   Smoking status: Never Smoker   Smokeless tobacco: Never Used  Substance Use Topics   Alcohol use: No   Drug use: No    Review of Systems Constitutional: No fever/chills Eyes: No visual changes. ENT:  No sore throat. Cardiovascular: Denies chest pain. Respiratory: Denies shortness of breath (initially), although some shortness of breath with exertion and a mild cough recently. Gastrointestinal: No abdominal pain.  No nausea, no vomiting.  No diarrhea.  No constipation. Genitourinary: Negative for dysuria. Musculoskeletal: Some swelling in bilateral lower extremities.  Negative for neck pain.  Negative for back pain. Integumentary: Negative for rash. Neurological: Negative for headaches, focal weakness or numbness.   ____________________________________________   PHYSICAL EXAM:  VITAL SIGNS: ED Triage Vitals  Enc Vitals Group     BP 18-May-2018 0206 113/75     Pulse Rate May 18, 2018 0206 (!) 122     Resp 05-18-2018 0206 20     Temp 05-18-2018 0206 98.9 F (37.2 C)     Temp Source May 18, 2018 0206 Oral  SpO2 04/27/2018 0206 96 %     Weight 2018-04-27 0205 58.9 kg (129 lb 13.6 oz)     Height 04-27-18 0205 1.626 m (5\' 4" )     Head Circumference --      Peak Flow --      Pain Score 2018-04-27 0204 4     Pain Loc --      Pain Edu? --      Excl. in [place]? --     Constitutional: Alert and oriented.  Appears chronically ill but in no distress upon arrival. Eyes: Conjunctivae are normal.  Head: Atraumatic.  He has some subacute bruising on the right side of his face and his body, but no evidence of acute trauma. Nose: No congestion/rhinnorhea. Mouth/Throat: Mucous membranes are moist. Neck: No stridor.  No meningeal signs.  No cervical spine tenderness to palpation. Cardiovascular: Moderate tachycardia with irregularly irregular rhythm. Good peripheral circulation. Grossly normal heart sounds. Respiratory: Normal respiratory effort.  No retractions. Lungs CTAB. Gastrointestinal: Soft and nontender. No distention.  Musculoskeletal: 1+ pitting edema in bilateral lower extremities. No gross deformities of extremities. Neurologic:  Normal speech and language. No gross focal neurologic deficits are  appreciated.  Skin:  Skin is warm, dry and intact. No rash noted.  Subacute bruising as described above.   ____________________________________________   LABS (all labs ordered are listed, but only abnormal results are displayed)  Labs Reviewed  CBC WITH DIFFERENTIAL/PLATELET - Abnormal; Notable for the following components:      Result Value   RBC 4.15 (*)    Hemoglobin 12.5 (*)    HCT 37.8 (*)    Platelets 130 (*)    Lymphs Abs 0.4 (*)    All other components within normal limits  PROTIME-INR - Abnormal; Notable for the following components:   Prothrombin Time 65.3 (*)    INR 7.9 (*)    All other components within normal limits  APTT - Abnormal; Notable for the following components:   aPTT 61 (*)    All other components within normal limits  TROPONIN I - Abnormal; Notable for the following components:   Troponin I 0.19 (*)    All other components within normal limits  COMPREHENSIVE METABOLIC PANEL - Abnormal; Notable for the following components:   Sodium 133 (*)    CO2 21 (*)    Glucose, Bld 136 (*)    BUN 44 (*)    Calcium 8.3 (*)    AST 145 (*)    ALT 123 (*)    Total Bilirubin 2.1 (*)    All other components within normal limits  BRAIN NATRIURETIC PEPTIDE - Abnormal; Notable for the following components:   B Natriuretic Peptide 3,090.0 (*)    All other components within normal limits  LACTIC ACID, PLASMA - Abnormal; Notable for the following components:   Lactic Acid, Venous 2.6 (*)    All other components within normal limits  CULTURE, BLOOD (ROUTINE X 2)  CULTURE, BLOOD (ROUTINE X 2)  URINE CULTURE  RESPIRATORY PANEL BY PCR  EXPECTORATED SPUTUM ASSESSMENT W REFEX TO RESP CULTURE  MRSA PCR SCREENING  MAGNESIUM  PROCALCITONIN  TECHNOLOGIST SMEAR REVIEW  LACTIC ACID, PLASMA  URINALYSIS, COMPLETE (UACMP) WITH MICROSCOPIC  INFLUENZA PANEL BY PCR (TYPE A & B)  CBC WITH DIFFERENTIAL/PLATELET   ____________________________________________  EKG  ED ECG  REPORT I, Hinda Kehr, the attending physician, personally viewed and interpreted this ECG.  Date: Apr 27, 2018 EKG Time: 2:17 AM Rate: 139 Rhythm: A. fib with RVR  QRS Axis: Left axis deviation Intervals: Abnormal secondary to A. fib ST/T Wave abnormalities: Non-specific ST segment / T-wave changes, but no clear evidence of acute ischemia. Narrative Interpretation: no definitive evidence of acute ischemia; does not meet STEMI criteria.   ____________________________________________  RADIOLOGY I, Hinda Kehr, personally viewed and evaluated these images (plain radiographs) as part of my medical decision making, as well as reviewing the written report by the radiologist.  ED MD interpretation: No indication of acute traumatic injury on CT head nor her CT cervical spine.  The CT chest is notable for what appears to be multifocal pneumonia and moderate bilateral pleural effusions.  Official radiology report(s): Ct Head Wo Contrast  Result Date: 05/14/2018 CLINICAL DATA:  Head trauma, fall EXAM: CT HEAD WITHOUT CONTRAST CT CERVICAL SPINE WITHOUT CONTRAST TECHNIQUE: Multidetector CT imaging of the head and cervical spine was performed following the standard protocol without intravenous contrast. Multiplanar CT image reconstructions of the cervical spine were also generated. COMPARISON:  Head CT 04/05/2018 FINDINGS: CT HEAD FINDINGS Brain: There is no mass, hemorrhage or extra-axial collection. There is generalized atrophy without lobar predilection. There is hypoattenuation of the periventricular white matter, most commonly indicating chronic ischemic microangiopathy. Old left basal ganglia lacunar infarct. Vascular: No abnormal hyperdensity of the major intracranial arteries or dural venous sinuses. No intracranial atherosclerosis. Skull: The visualized skull base, calvarium and extracranial soft tissues are normal. Sinuses/Orbits: No fluid levels or advanced mucosal thickening of the visualized  paranasal sinuses. No mastoid or middle ear effusion. The orbits are normal. CT CERVICAL SPINE FINDINGS Alignment: No static subluxation. Facets are aligned. Occipital condyles are normally positioned. Skull base and vertebrae: No acute fracture. Soft tissues and spinal canal: No prevertebral fluid or swelling. No visible canal hematoma. Disc levels: No advanced spinal canal or neural foraminal stenosis. Upper chest: Large pleural effusions. Other: Normal visualized paraspinal cervical soft tissues. IMPRESSION: 1. Generalized atrophy and sequelae of chronic ischemic microangiopathy without acute intracranial abnormality. 2. No acute fracture or static subluxation of the cervical spine. 3. Large pleural effusions. Electronically Signed   By: Ulyses Jarred M.D.   On: 05-14-2018 03:24   Ct Chest Wo Contrast  Result Date: 05/14/18 CLINICAL DATA:  Shortness of breath and productive cough. Fall. EXAM: CT CHEST WITHOUT CONTRAST TECHNIQUE: Multidetector CT imaging of the chest was performed following the standard protocol without IV contrast. COMPARISON:  10/08/2017 FINDINGS: Cardiovascular: Cardiac enlargement. Small pericardial effusions. Coronary artery calcifications. Dilated ascending thoracic aorta at 4 cm maximal diameter. No change. Aortic calcifications are present. Mediastinum/Nodes: Esophagus is decompressed. No significant lymphadenopathy in the chest. Lungs/Pleura: Motion artifact limits examination. Moderate bilateral pleural effusions are present. Larger on the right. Patchy peribronchovascular and nodular ground-glass infiltrates in the lungs. This is likely to represent multifocal pneumonia. Airways are patent with scattered areas of bronchiectasis present. No pneumothorax. Upper Abdomen: No acute abnormality. Musculoskeletal: No chest wall mass or suspicious bone lesions identified. IMPRESSION: 1. Cardiac enlargement with small pericardial effusion. 2. Moderate bilateral pleural effusions, larger on  the right. 3. Patchy peribronchovascular and nodular ground-glass infiltrates in the lungs likely representing multifocal pneumonia. 4. Scattered bronchiectasis. 5. 4 cm diameter ascending thoracic aorta. Recommend annual imaging followup by CTA or MRA. This recommendation follows 2010 ACCF/AHA/AATS/ACR/ASA/SCA/SCAI/SIR/STS/SVM Guidelines for the Diagnosis and Management of Patients with Thoracic Aortic Disease. 2010; 121: [id number]. Electronically Signed   By: Lucienne Capers M.D.   On: 05-14-18 03:38   Ct Cervical Spine Wo Contrast  Result Date: 2018-05-14 CLINICAL DATA:  Head trauma, fall EXAM: CT HEAD WITHOUT CONTRAST CT CERVICAL SPINE WITHOUT CONTRAST TECHNIQUE: Multidetector CT imaging of the head and cervical spine was performed following the standard protocol without intravenous contrast. Multiplanar CT image reconstructions of the cervical spine were also generated. COMPARISON:  Head CT 04/05/2018 FINDINGS: CT HEAD FINDINGS Brain: There is no mass, hemorrhage or extra-axial collection. There is generalized atrophy without lobar predilection. There is hypoattenuation of the periventricular white matter, most commonly indicating chronic ischemic microangiopathy. Old left basal ganglia lacunar infarct. Vascular: No abnormal hyperdensity of the major intracranial arteries or dural venous sinuses. No intracranial atherosclerosis. Skull: The visualized skull base, calvarium and extracranial soft tissues are normal. Sinuses/Orbits: No fluid levels or advanced mucosal thickening of the visualized paranasal sinuses. No mastoid or middle ear effusion. The orbits are normal. CT CERVICAL SPINE FINDINGS Alignment: No static subluxation. Facets are aligned. Occipital condyles are normally positioned. Skull base and vertebrae: No acute fracture. Soft tissues and spinal canal: No prevertebral fluid or swelling. No visible canal hematoma. Disc levels: No advanced spinal canal or neural foraminal stenosis. Upper  chest: Large pleural effusions. Other: Normal visualized paraspinal cervical soft tissues. IMPRESSION: 1. Generalized atrophy and sequelae of chronic ischemic microangiopathy without acute intracranial abnormality. 2. No acute fracture or static subluxation of the cervical spine. 3. Large pleural effusions. Electronically Signed   By: Ulyses Jarred M.D.   On: 2018/05/11 03:24    ____________________________________________   PROCEDURES   Procedure(s) performed (including Critical Care):  .Critical Care Performed by: Hinda Kehr, MD Authorized by: Hinda Kehr, MD   Critical care provider statement:    Critical care time (minutes):  60   Critical care time was exclusive of:  Separately billable procedures and treating other patients   Critical care was necessary to treat or prevent imminent or life-threatening deterioration of the following conditions:  Respiratory failure, circulatory failure and sepsis   Critical care was time spent personally by me on the following activities:  Development of treatment plan with patient or surrogate, discussions with consultants, evaluation of patient's response to treatment, examination of patient, obtaining history from patient or surrogate, ordering and performing treatments and interventions, ordering and review of laboratory studies, ordering and review of radiographic studies, pulse oximetry, re-evaluation of patient's condition and review of old charts     ____________________________________________   [place] / MDM / [place] / ED COURSE  As part of my medical decision making, I reviewed the following data within the electronic MEDICAL RECORD NUMBER History obtained from family, Nursing notes reviewed and incorporated, Labs reviewed , EKG interpreted , Old chart reviewed, Discussed with admitting physician  and Notes from prior ED visits         Differential diagnosis includes, but is not limited to, A. fib with RVR,  ACS, acute infection such as pneumonia, CHF exacerbation, transient arrhythmia leading to syncopal episode.  The patient is well-appearing upon arrival and has no chest pain or shortness of breath.  He does not remember what happened but it sounds like he may have had a syncopal episode and that is concerning for the possibility of cardiogenic syncope given that he had no prodrome and does not remember what happened.  Upon arrival he is in A. fib with RVR with a rate between 120 and 140 and I reviewed the medical record and saw her he was previously on metoprolol.  I will give him up to 3 doses of metoprolol 5 mg IV to see if  this will improve his heart rate and possibly converted back to normal sinus rhythm although the goal is rate control.  His vital signs are stable although his blood pressure is a little bit on the low side of normal.  He is in no respiratory distress currently although he does have some peripheral edema.  His son is on his way but is not currently available with the patient does not have any infectious symptoms at this time and is not short of breath and has clear lung sounds.  I will check broad-spectrum lab work and also a CT of the head and cervical spine since he may have had a fall and he cannot verify any history and he is on Xarelto.  I will reassess frequently.    Clinical Course as of Apr 26 547  Sun Apr 26, 2018  0321 I received word that the patient's lung apices were concerning at the bottom of the C-spine imaging so I ordered a CT chest without contrast for further evaluation.  The patient notably became short of breath while he was in the scanner.  He is come back to the ED now.   [CF]  [phone] Discussed case in person with Dr. Lodema Pilot who will admit.   [CF]  0448 Lactic Acid, Venous(!!): 2.6 [CF]  0448 Tech Review: MORPHOLOGY UNREMARKABLE [CF]  0448 INR(!!): 7.9 [CF]  0448 B Natriuretic Peptide(!): 3,090.0 [CF]  0448 No acute abnormalities on CT head nor CT cervical  spine.   [CF]  [phone]  (Note that documentation was delayed due to multiple ED patients requiring immediate care.)  In short, as documented above, the patient became acutely short of breath while he was in the CT scanner.  When he came back to the room he was first placed on 4 L of oxygen by nasal cannula but his waveform was erratic and dipped down into the 80s and he was tachypneic in the 30s to 40s.  He was extremely ill-appearing at this point I was concerned he may require emergent intubation.  I placed him on a nonrebreather as a temporizing measure.I updated the son at the bedside that his CT scan is concerning both for pleural effusions and multifocal pneumonia.  We had a discussion about DNR/DNI.  The patient and his son agreed to no chest compressions but yes to intubation if necessary.  I started him on BiPAP as a bridge if intubation was needed but he improved immediately on BiPAP.  In the meantime his lactic acid came back at 2.6 and his BNP came back at greater than 3000.I believe this is a mixed picture of acute infection/sepsis leading to a CHF exacerbation.  He is highly coagulopathic with an INR of almost 8 which is somewhat difficult to explain given that he is on Xarelto and not on warfarin.  This is concerning for a severe sepsis picture although his blood pressure has remained stable.  I am concerned about him being volume overloaded given the peripheral edema in the BNP and the difficulty breathing so I am reluctant to give any additional fluids.  Of note, the metoprolol converted the patient to normal sinus rhythm.I am treating empirically for sepsis with cefepime 2 g IV and vancomycin 1 g IV and I spoke with the hospitalist as described above.I reassessed the patient again and he is looking much better on BiPAP.  Orders for limited code have been placed.   [CF]  [phone] Of note, although we are currently in the midst of  COVID-19 concerns, he has had no contact with anyone who is been  traveling to an endemic area and he has not been doing any traveling.  He is essentially homebound and none of his family members have been ill.  The chances of this being COVID-19 are vanishingly small and I will defer to the hospitalist for additional management but I do not believe he meets the stringent evaluation guidelines that are currently in place.   [CF]    Clinical Course User Index [CF] Hinda Kehr, MD    ____________________________________________  FINAL CLINICAL IMPRESSION(S) / ED DIAGNOSES  Final diagnoses:  Acute on chronic heart failure, unspecified heart failure type ([place])  Demand ischemia (HCC)  Elevated troponin I level  Coagulopathy (HCC)  Elevated INR  Multifocal pneumonia  Atrial fibrillation with RVR ([place])  Syncope and collapse     MEDICATIONS GIVEN DURING THIS VISIT:  Medications  metoprolol tartrate (LOPRESSOR) injection 5 mg (5 mg Intravenous Given 05/09/2018 0250)  vancomycin (VANCOCIN) 1,500 mg in sodium chloride 0.9 % 500 mL IVPB (1,500 mg Intravenous New Bag/Given 05-09-18 0431)  mirtazapine (REMERON) tablet 15 mg (has no administration in time range)  zolpidem (AMBIEN) tablet 5 mg (has no administration in time range)  pantoprazole (PROTONIX) EC tablet 40 mg (has no administration in time range)  Align CAPS 4 mg (has no administration in time range)  tamsulosin (FLOMAX) capsule 0.4 mg (has no administration in time range)  Vitamin B 12 LOZG 1 tablet (has no administration in time range)  cholecalciferol (VITAMIN D3) tablet 3,000 Units (has no administration in time range)  azelastine (ASTELIN) 0.1 % nasal spray 1 spray (has no administration in time range)  loratadine (CLARITIN) tablet 10 mg (has no administration in time range)  montelukast (SINGULAIR) tablet 10 mg (has no administration in time range)  sodium chloride flush (NS) 0.9 % injection 3 mL (has no administration in time range)  sodium chloride flush (NS) 0.9 % injection 3 mL (has no  administration in time range)  0.9 %  sodium chloride infusion (has no administration in time range)  acetaminophen (TYLENOL) tablet 650 mg (has no administration in time range)  ondansetron (ZOFRAN) injection 4 mg (has no administration in time range)  furosemide (LASIX) injection 40 mg (40 mg Intravenous Given 2018/05/09 0519)  guaiFENesin (MUCINEX) 12 hr tablet 600 mg (has no administration in time range)  ipratropium-albuterol (DUONEB) 0.5-2.5 (3) MG/3ML nebulizer solution 3 mL (has no administration in time range)  piperacillin-tazobactam (ZOSYN) IVPB 3.375 g (has no administration in time range)  vancomycin (VANCOCIN) IVPB 750 mg/150 ml premix (has no administration in time range)  ceFEPIme (MAXIPIME) 2 g in sodium chloride 0.9 % 100 mL IVPB (0 g Intravenous Stopped May 09, 2018 0519)     ED Discharge Orders    None       Note:  This document was prepared using Dragon voice recognition software and may include unintentional dictation errors.   Hinda Kehr, MD 05-09-18 [phone]

## 2018-05-13 NOTE — H&P (Signed)
[facility] NAME: Derek James    MR#:  [id number]  DATE OF BIRTH:  1930/08/08  DATE OF ADMISSION:  05/08/2018  PRIMARY CARE PHYSICIAN: Maryland Pink, MD   REQUESTING/REFERRING PHYSICIAN: Les Pou, MD CHIEF COMPLAINT:   Chief Complaint  Patient presents with   Fall    HISTORY OF PRESENT ILLNESS:  Derek James  is a 85 y.o. male with a known history of diastolic CHF, atrial fibrillation, GERD and hypertension who presented to the emergency room with acute onset of fall likely secondary to syncopal episode as the patient does not remember everything.  His son stated that he was taking off his pants to change to his pajamas when this happened.  He has been having dyspnea as well as cough productive of greenish-brown sputum over the last 4 days.  He is also been having worsening lower extremity edema with orthopnea and paroxysmal nocturnal dyspnea over the last 5 days.  He was recently given Lasix that he used over the last 3 days.  No chest pain or palpitations.  No nausea or vomiting or abdominal pain.  No dysuria, oliguria or hematuria or flank pain.  He did not have any abdominal pain or melena or bright red bleeding per rectum.  No other bleeding diathesis.  According to his son his temperature when EMS arrived was 100.8 but he was afebrile here in the ER with a temperature of 98.9.  Upon presentation to the emergency room, he was tachycardic with a heart rate of 122 and his EKG showed atrial fibrillation with rapid ventricular response of 139 with left axis deviation and Q waves in V1 through V3.  He became more tachypneic with respiratory to 33 and tachycardic and his pulse continues dropped to the 40s.  He was placed on 100% nonrebreather and later BiPAP with clinical improvement.  His head CT scan as well as C-spine CT showed no acute abnormalities as mentioned below.  Chest CT without contrast revealed cardiomegaly and  small pericardial effusion as well as moderate bilateral pleural effusions, larger on the right with patchy peribronchial and nodular groundglass infiltrates in the lungs likely representing multifocal pneumonia.  There was scattered bronchiectasis and 4 cm diameter of the ascending thoracic aorta with recommendation for annual imaging by CTA or MRA.  The patient was given 5 mg of IV Lopressor and 2 g of IV cefepime as well as IV vancomycin.  He will be admitted to stepdown unit for further evaluation and management. PAST MEDICAL HISTORY:   Past Medical History:  Diagnosis Date   A-fib [facility])    a. new onset 05/2015; b. CHADS2VASC => 4 (CHF, HTN, age x 2)-->Xarelto initiated-->converted to sinus w/ pac's   Cancer (HCC)    skin ca   Chronic diastolic CHF (congestive heart failure) ([place])    a. echo 05/2015: Ef 50-55%, rhythm Afib, moderately dilated aortic root, mild AI, moderate MR, LA normal in size, PASP 44 mmHg   GERD (gastroesophageal reflux disease)    Hypertension    Rhinitis     PAST SURGICAL HISTORY:   Past Surgical History:  Procedure Laterality Date   COLONOSCOPY WITH PROPOFOL N/A 09/05/2014   Procedure: COLONOSCOPY WITH PROPOFOL;  Surgeon: Manya Silvas, MD;  Location: [facility];  Service: Endoscopy;  Laterality: N/A;   EYE SURGERY     retinal detatchment   HERNIA REPAIR     INNER EAR SURGERY      SOCIAL  HISTORY:   Social History   Tobacco Use   Smoking status: Never Smoker   Smokeless tobacco: Never Used  Substance Use Topics   Alcohol use: No    FAMILY HISTORY:   Family History  Problem Relation Age of Onset   Hypertension Mother    Hypertension Father    CAD Father    Bladder Cancer Neg Hx    Kidney cancer Neg Hx    Prostate cancer Neg Hx     DRUG ALLERGIES:   Allergies  Allergen Reactions   Dexlansoprazole Diarrhea   Escitalopram Diarrhea   Lansoprazole Diarrhea   Shrimp [Shellfish Allergy]     REVIEW OF SYSTEMS:     ROS As per history of present illness. All pertinent systems were reviewed above. Constitutional,  HEENT, cardiovascular, respiratory, GI, GU, musculoskeletal, neuro, psychiatric, endocrine,  integumentary and hematologic systems were reviewed and are otherwise  negative/unremarkable except for positive findings mentioned above in the HPI.   MEDICATIONS AT HOME:   Prior to Admission medications   Medication Sig Start Date End Date Taking? Authorizing Provider  azelastine (ASTELIN) 0.1 % nasal spray Place 1 spray into both nostrils 2 (two) times daily. Use in each nostril as directed    [provider]  Cholecalciferol (VITAMIN D3) 3000 units TABS Take by mouth.    [provider]  Cyanocobalamin (VITAMIN B 12 PO) Take by mouth.    [provider]  dexlansoprazole (DEXILANT) 60 MG capsule Take 60 mg by mouth daily.    [provider]  esomeprazole (NEXIUM) 20 MG capsule Take 20 mg by mouth at bedtime.     [provider]  loratadine (CLARITIN) 10 MG tablet Take 10 mg by mouth daily.    [provider]  mirtazapine (REMERON) 15 MG tablet TAKE 1 TABLET BY MOUTH EVERY DAY AT NIGHT 05/16/17   [provider]  montelukast (SINGULAIR) 10 MG tablet TAKE 1 TABLET BY MOUTH EVERY DAY AT NIGHT 06/27/17   [provider]  Probiotic Product (ALIGN) 4 MG CAPS Take 4 mg by mouth daily. 10/24/14   [provider]  rivaroxaban (XARELTO) 20 MG TABS tablet Take 1 tablet (20 mg total) by mouth daily with supper. 06/02/15   Hillary Bow, MD  tamsulosin (FLOMAX) 0.4 MG CAPS capsule Take 0.4 mg by mouth daily.     [provider]  zolpidem (AMBIEN) 10 MG tablet Take 5 mg by mouth at bedtime as needed for sleep.  05/22/15   [provider]      VITAL SIGNS:  Blood pressure 97/65, pulse 92, temperature 98.9 F (37.2 C), temperature source Oral, resp. rate (!) 32, height 5\' 4"  (1.626 m), weight 58.9 kg, SpO2 100  %.  PHYSICAL EXAMINATION:  Physical Exam  GENERAL: Acutely ill, 85 y.o.-year-old Caucasian patient lying in the bed in moderate respiratory distress on BiPAP. EYES: Pupils equal, round, reactive to light and accommodation. No scleral icterus. Extraocular muscles intact.  HEENT: Head atraumatic, normocephalic. Oropharynx: BiPAP mask in place. NECK:  Supple, no jugular venous distention. No thyroid enlargement, no tenderness.  LUNGS: Diminished bibasilar breath sounds with bibasilar crackles and increased work of breathing.  CARDIOVASCULAR: S1, S2 normal. No murmurs, rubs, or gallops.  ABDOMEN: Soft, nontender, nondistended. Bowel sounds present. No organomegaly or mass.  EXTREMITIES: 2-3+ bilateral soft lower extremity pedal edema, with no cyanosis, or clubbing.  NEUROLOGIC: Cranial nerves II through XII are intact. Muscle strength 5/5 in all extremities. Sensation intact. Gait not checked.  PSYCHIATRIC: The patient is alert and oriented x 3.  SKIN: No obvious rash, lesion, or ulcer.   LABORATORY PANEL:   CBC Recent Labs  Lab 05/22/18 0243  WBC 8.6  HGB 12.5*  HCT 37.8*  PLT 130*   ------------------------------------------------------------------------------------------------------------------  Chemistries  Recent Labs  Lab 05/22/2018 0243  NA 133*  K 4.4  CL 102  CO2 21*  GLUCOSE 136*  BUN 44*  CREATININE 1.11  CALCIUM 8.3*  MG 2.0  AST 145*  ALT 123*  ALKPHOS 88  BILITOT 2.1*   ------------------------------------------------------------------------------------------------------------------  Cardiac Enzymes Recent Labs  Lab 22-May-2018 0243  TROPONINI 0.19*   ------------------------------------------------------------------------------------------------------------------  RADIOLOGY:  Ct Head Wo Contrast  Result Date: 05/22/18 CLINICAL DATA:  Head trauma, fall EXAM: CT HEAD WITHOUT CONTRAST CT CERVICAL SPINE WITHOUT CONTRAST TECHNIQUE: Multidetector CT  imaging of the head and cervical spine was performed following the standard protocol without intravenous contrast. Multiplanar CT image reconstructions of the cervical spine were also generated. COMPARISON:  Head CT 04/05/2018 FINDINGS: CT HEAD FINDINGS Brain: There is no mass, hemorrhage or extra-axial collection. There is generalized atrophy without lobar predilection. There is hypoattenuation of the periventricular white matter, most commonly indicating chronic ischemic microangiopathy. Old left basal ganglia lacunar infarct. Vascular: No abnormal hyperdensity of the major intracranial arteries or dural venous sinuses. No intracranial atherosclerosis. Skull: The visualized skull base, calvarium and extracranial soft tissues are normal. Sinuses/Orbits: No fluid levels or advanced mucosal thickening of the visualized paranasal sinuses. No mastoid or middle ear effusion. The orbits are normal. CT CERVICAL SPINE FINDINGS Alignment: No static subluxation. Facets are aligned. Occipital condyles are normally positioned. Skull base and vertebrae: No acute fracture. Soft tissues and spinal canal: No prevertebral fluid or swelling. No visible canal hematoma. Disc levels: No advanced spinal canal or neural foraminal stenosis. Upper chest: Large pleural effusions. Other: Normal visualized paraspinal cervical soft tissues. IMPRESSION: 1. Generalized atrophy and sequelae of chronic ischemic microangiopathy without acute intracranial abnormality. 2. No acute fracture or static subluxation of the cervical spine. 3. Large pleural effusions. Electronically Signed   By: Ulyses Jarred M.D.   On: 2018/05/22 03:24   Ct Chest Wo Contrast  Result Date: 2018-05-22 CLINICAL DATA:  Shortness of breath and productive cough. Fall. EXAM: CT CHEST WITHOUT CONTRAST TECHNIQUE: Multidetector CT imaging of the chest was performed following the standard protocol without IV contrast. COMPARISON:  10/08/2017 FINDINGS: Cardiovascular: Cardiac  enlargement. Small pericardial effusions. Coronary artery calcifications. Dilated ascending thoracic aorta at 4 cm maximal diameter. No change. Aortic calcifications are present. Mediastinum/Nodes: Esophagus is decompressed. No significant lymphadenopathy in the chest. Lungs/Pleura: Motion artifact limits examination. Moderate bilateral pleural effusions are present. Larger on the right. Patchy peribronchovascular and nodular ground-glass infiltrates in the lungs. This is likely to represent multifocal pneumonia. Airways are patent with scattered areas of bronchiectasis present. No pneumothorax. Upper Abdomen: No acute abnormality. Musculoskeletal: No chest wall mass or suspicious bone lesions identified. IMPRESSION: 1. Cardiac enlargement with small pericardial effusion. 2. Moderate bilateral pleural effusions, larger on the right. 3. Patchy peribronchovascular and nodular ground-glass infiltrates in the lungs likely representing multifocal pneumonia. 4. Scattered bronchiectasis. 5. 4 cm diameter ascending thoracic aorta. Recommend annual imaging followup by CTA or MRA. This recommendation follows 2010 ACCF/AHA/AATS/ACR/ASA/SCA/SCAI/SIR/STS/SVM Guidelines for the Diagnosis and Management of Patients with Thoracic Aortic Disease. 2010; 121: J696-V893. Electronically Signed   By: Lucienne Capers M.D.   On: May 22, 2018 03:38   Ct Cervical Spine Wo Contrast  Result Date: 05-22-2018 CLINICAL DATA:  Head trauma, fall EXAM: CT HEAD WITHOUT CONTRAST CT CERVICAL SPINE WITHOUT CONTRAST TECHNIQUE: Multidetector CT imaging of the head and cervical spine was performed following the standard protocol without intravenous contrast. Multiplanar CT image reconstructions of the cervical spine were also generated. COMPARISON:  Head CT 04/05/2018 FINDINGS: CT HEAD FINDINGS Brain: There is no mass, hemorrhage or extra-axial collection. There is generalized atrophy without lobar predilection. There is hypoattenuation of the  periventricular white matter, most commonly indicating chronic ischemic microangiopathy. Old left basal ganglia lacunar infarct. Vascular: No abnormal hyperdensity of the major intracranial arteries or dural venous sinuses. No intracranial atherosclerosis. Skull: The visualized skull base, calvarium and extracranial soft tissues are normal. Sinuses/Orbits: No fluid levels or advanced mucosal thickening of the visualized paranasal sinuses. No mastoid or middle ear effusion. The orbits are normal. CT CERVICAL SPINE FINDINGS Alignment: No static subluxation. Facets are aligned. Occipital condyles are normally positioned. Skull base and vertebrae: No acute fracture. Soft tissues and spinal canal: No prevertebral fluid or swelling. No visible canal hematoma. Disc levels: No advanced spinal canal or neural foraminal stenosis. Upper chest: Large pleural effusions. Other: Normal visualized paraspinal cervical soft tissues. IMPRESSION: 1. Generalized atrophy and sequelae of chronic ischemic microangiopathy without acute intracranial abnormality. 2. No acute fracture or static subluxation of the cervical spine. 3. Large pleural effusions. Electronically Signed   By: Ulyses Jarred M.D.   On: 05/17/2018 03:24      IMPRESSION AND PLAN:  #1.  Acute on top of chronic diastolic CHF with subsequent acute hypoxic respiratory failure.  The patient will be admitted to a telemetry bed and will be diuresed with IV Lasix.  Will follow serial cardiac enzymes.  Will obtain a 2D echo as the last one was in 2017 revealing an EF of 55% and a cardiology consultation in a.m. by Dr. Nehemiah Massed.  We will continue BiPAP as needed and taper off as tolerated.  The patient has elevated LFTs likely secondary to congestive hepatopathy.  His LFTs will be followed in a.m.  2.  Multifocal pneumonia, contributing to his acute hypoxic respiratory failure.  The patient will be placed on IV vancomycin and Zosyn.  Given his bilateral groundglass  densities we will obtain respiratory viral panel by PCR.  The patient will be placed on mucolytic therapy with Mucinex and PRN duo nebs.  Probiotic will be continued.  We will follow blood cultures and obtain sputum Gram stain culture and sensitivity.  An ID consultation will be obtained by Dr. Delaine Lame.  3.  Atrial fibrillation with rapid ventricular response.  He has responded to IV Lopressor and his rate will be monitored.  4.  Coagulopathy.  This is only secondary to Xarelto that will be held off.  Will follow INR.  The patient's hemoglobin and hematocrit are fairly stable.  5.  Syncope and fall without injuries.  This is likely secondary to #1, 2 and 3, mainly his atrial fibrillation and associated hypoxia with acute respiratory failure.   6.  Hypertension.  His blood pressure is fairly controlled.  7.  GERD.  PPI therapy will be resumed  8.  DVT prophylaxis.  The patient is currently coagulopathic therefore no medical prophylaxis or mechanical prophylaxis will be provided.   All the records are reviewed and case discussed with ED provider.  I also discussed the case in details with the intensivist on-call Dr. Genevive Bi. Management plans discussed with the patient and his son and they are in agreement.  CODE STATUS: The patient is DNI  only.  They want CPR and other resuscitation elements.  TOTAL CRITICAL CARE TIME WITH THIS PATIENT: 60 minutes.    Christel Mormon M.D on 05-16-2018 at [facility] AM  Pager - [phone]  After 6pm go to [email or web address] - Proofreader  [facility]  Office  [phone]  CC: Primary care physician; Maryland Pink, MD   Note: This dictation was prepared with Dragon dictation along with smaller phrase technology. Any transcriptional errors that result from this process are unintentional.

## 2018-05-13 NOTE — Progress Notes (Signed)
[facility] Progress Note Patient Name: Derek James DOB: [birth year] MRN: [id number]   Date of Service  May 22, 2018  HPI/Events of Note  85 y/o M afib on xarelto, HFpEF, presented after a fall, no sign of trauma on workup, but did reveal bilateral effusion and patchy ground glass opacities. Report of 100.8 temp via EMS. Given lopressor and started on vancomycin and cefepime.  eICU Interventions  Continue diureses On broad spectrum antibiotics but should be able to de-escalate soon Awaiting viral studies     Intervention Category Evaluation Type: New Patient Evaluation  Judd Lien 05/22/2018, 6:30 AM

## 2018-05-13 NOTE — ED Notes (Signed)
CT tech called RN to alert that pt was significantly SHOB during CT scans. RN to CT and assisted pt back to department. Placed back on monitor and SPO2. Pt tachypenic in the 40's, HR has improved back to NSR with a HR in the 90'S. Repeat EKG has been printed. Pt placed on 2L [facility], Provider brought to bedside.

## 2018-05-13 NOTE — ED Notes (Signed)
RT paged to place pt on BIPAP per MD Karma Greaser.

## 2018-05-13 NOTE — Progress Notes (Signed)
   2018/05/18 1334  Clinical Encounter Type  Visited With Patient and family together  Visit Type Initial  Referral From Nurse  Consult/Referral To Chaplain  Spiritual Encounters  Spiritual Needs Emotional  CH received page at [phone]. Patient was deceased when [facility] entered room. Waited in room until son arrived. Son declined pastoral care. Informed patient's RN of family's wishes and informed them to page/call if they needed support.

## 2018-05-13 NOTE — Consult Note (Signed)
[facility] Consultation Note  Patient ID: Derek James, MRN: [id number], DOB/AGE: 15-Nov-1930 85 y.o. Admit date: May 22, 2018   Date of Consult: 2018-05-22 Primary Physician: Maryland Pink, MD Primary Cardiologist: Ubaldo Glassing  Chief Complaint:  Chief Complaint  Patient presents with  . Fall   Reason for Consult: Atrial fibrillation  HPI: 85 y.o. male with known chronic diastolic dysfunction congestive heart failure coronary artery disease by CAT scan aortic aneurysm of 4 cm by CAT scan with essential hypertension mixed hyperlipidemia having significant new pulmonary congestion with acute on chronic systolic dysfunction heart failure with pulmonary edema and pleural effusions as well as elevated BNP of 3090.  The patient has had severe shortness of breath and was placed in the ICU after having also atrial fibrillation with rapid ventricular rate.  He was given intravenous metoprolol with good heart rate control although this is worn off and now needs further treatment with a heart rate in the 120 bpm range.  There is been no evidence of chest pain or true angina although the patient does have a troponin of 0.19 consistent with heart failure atrial fibrillation and demand ischemia rather than acute coronary syndrome.  Today is feeling better and able to eat with no evidence of chest pain  Past Medical History:  Diagnosis Date  . A-fib ([place])    a. new onset 05/2015; b. CHADS2VASC => 4 (CHF, HTN, age x 2)-->Xarelto initiated-->converted to sinus w/ pac's  . Cancer ([place])    skin ca  . Chronic diastolic CHF (congestive heart failure) ([place])    a. echo 05/2015: Ef 50-55%, rhythm Afib, moderately dilated aortic root, mild AI, moderate MR, LA normal in size, PASP 44 mmHg  . GERD (gastroesophageal reflux disease)   . Hypertension   . Rhinitis       Surgical History:  Past Surgical History:  Procedure Laterality Date  . COLONOSCOPY WITH PROPOFOL N/A 09/05/2014   Procedure:  COLONOSCOPY WITH PROPOFOL;  Surgeon: Manya Silvas, MD;  Location: [facility] [place] ENDOSCOPY;  Service: Endoscopy;  Laterality: N/A;  . EYE SURGERY     retinal detatchment  . HERNIA REPAIR    . INNER EAR SURGERY       Home Meds: Prior to Admission medications   Medication Sig Start Date End Date Taking? Authorizing Provider  azelastine (ASTELIN) 0.1 % nasal spray Place 1 spray into both nostrils 2 (two) times daily. Use in each nostril as directed    [provider]  Cholecalciferol (VITAMIN D3) 3000 units TABS Take by mouth.    [provider]  Cyanocobalamin (VITAMIN B 12 PO) Take by mouth.    [provider]  dexlansoprazole (DEXILANT) 60 MG capsule Take 60 mg by mouth daily.    [provider]  esomeprazole (NEXIUM) 20 MG capsule Take 20 mg by mouth at bedtime.     [provider]  loratadine (CLARITIN) 10 MG tablet Take 10 mg by mouth daily.    [provider]  mirtazapine (REMERON) 15 MG tablet TAKE 1 TABLET BY MOUTH EVERY DAY AT NIGHT 05/16/17   [provider]  montelukast (SINGULAIR) 10 MG tablet TAKE 1 TABLET BY MOUTH EVERY DAY AT NIGHT 06/27/17   [provider]  Probiotic Product (ALIGN) 4 MG CAPS Take 4 mg by mouth daily. 10/24/14   [provider]  rivaroxaban (XARELTO) 20 MG TABS tablet Take 1 tablet (20 mg total) by mouth daily with supper. 06/02/15   Hillary Bow, MD  tamsulosin [facility])  0.4 MG CAPS capsule Take 0.4 mg by mouth daily.     [provider]  zolpidem (AMBIEN) 10 MG tablet Take 5 mg by mouth at bedtime as needed for sleep.  05/22/15   [provider]    Inpatient Medications:  . acidophilus  1 capsule Oral Daily  . azelastine  1 spray Each Nare BID  . cholecalciferol  3,000 Units Oral Daily  . furosemide  40 mg Intravenous Q12H  . guaiFENesin  600 mg Oral BID  . loratadine  10 mg Oral Daily  . metoprolol tartrate  50 mg Oral BID  . mirtazapine  15 mg Oral QHS  .  montelukast  10 mg Oral Daily  . pantoprazole  40 mg Oral Daily  . sodium chloride flush  3 mL Intravenous Q12H  . tamsulosin  0.4 mg Oral Daily  . vitamin B-12  100 mcg Oral Daily   . sodium chloride    . piperacillin-tazobactam (ZOSYN)  IV    . [START ON 04/27/2018] vancomycin      Allergies:  Allergies  Allergen Reactions  . Dexlansoprazole Diarrhea  . Escitalopram Diarrhea  . Lansoprazole Diarrhea  . Shrimp [Shellfish Allergy]     Social History   Socioeconomic History  . Marital status: Widowed    Spouse name: Not on file  . Number of children: Not on file  . Years of education: Not on file  . Highest education level: Not on file  Occupational History  . Not on file  Social Needs  . Financial resource strain: Not on file  . Food insecurity:    Worry: Not on file    Inability: Not on file  . Transportation needs:    Medical: Not on file    Non-medical: Not on file  Tobacco Use  . Smoking status: Never Smoker  . Smokeless tobacco: Never Used  Substance and Sexual Activity  . Alcohol use: No  . Drug use: No  . Sexual activity: Not Currently  Lifestyle  . Physical activity:    Days per week: Not on file    Minutes per session: Not on file  . Stress: Not on file  Relationships  . Social connections:    Talks on phone: Not on file    Gets together: Not on file    Attends religious service: Not on file    Active member of club or organization: Not on file    Attends meetings of clubs or organizations: Not on file    Relationship status: Not on file  . Intimate partner violence:    Fear of current or ex partner: Not on file    Emotionally abused: Not on file    Physically abused: Not on file    Forced sexual activity: Not on file  Other Topics Concern  . Not on file  Social History Narrative  . Not on file     Family History  Problem Relation Age of Onset  . Hypertension Mother   . Hypertension Father   . CAD Father   . Bladder Cancer Neg Hx   .  Kidney cancer Neg Hx   . Prostate cancer Neg Hx      Review of Systems Positive for shortness of breath cough congestion Negative for: General:  chills, fever, night sweats or weight changes.  Cardiovascular: PND orthopnea syncope dizziness  Dermatological skin lesions rashes Respiratory: Positive for cough congestion Urologic: Frequent urination urination at night and hematuria Abdominal: negative for nausea, vomiting,  diarrhea, bright red blood per rectum, melena, or hematemesis Neurologic: negative for visual changes, and/or hearing changes  All other systems reviewed and are otherwise negative except as noted above.  Labs: Recent Labs    28-Apr-2018 0243 28-Apr-2018 0700  CKTOTAL  --  65  TROPONINI 0.19*  --    Lab Results  Component Value Date   WBC 9.8 Apr 28, 2018   HGB 12.3 (L) 28-Apr-2018   HCT 37.5 (L) 04-28-2018   MCV 92.6 04-28-18   PLT 128 (L) 04/28/2018    Recent Labs  Lab 2018/04/28 0243  NA 133*  K 4.4  CL 102  CO2 21*  BUN 44*  CREATININE 1.11  CALCIUM 8.3*  PROT 6.5  BILITOT 2.1*  ALKPHOS 88  ALT 123*  AST 145*  GLUCOSE 136*   No results found for: CHOL, HDL, LDLCALC, TRIG No results found for: DDIMER  Radiology/Studies:  Ct Head Wo Contrast  Result Date: 04/28/2018 CLINICAL DATA:  Head trauma, fall EXAM: CT HEAD WITHOUT CONTRAST CT CERVICAL SPINE WITHOUT CONTRAST TECHNIQUE: Multidetector CT imaging of the head and cervical spine was performed following the standard protocol without intravenous contrast. Multiplanar CT image reconstructions of the cervical spine were also generated. COMPARISON:  Head CT 04/05/2018 FINDINGS: CT HEAD FINDINGS Brain: There is no mass, hemorrhage or extra-axial collection. There is generalized atrophy without lobar predilection. There is hypoattenuation of the periventricular white matter, most commonly indicating chronic ischemic microangiopathy. Old left basal ganglia lacunar infarct. Vascular: No abnormal hyperdensity of  the major intracranial arteries or dural venous sinuses. No intracranial atherosclerosis. Skull: The visualized skull base, calvarium and extracranial soft tissues are normal. Sinuses/Orbits: No fluid levels or advanced mucosal thickening of the visualized paranasal sinuses. No mastoid or middle ear effusion. The orbits are normal. CT CERVICAL SPINE FINDINGS Alignment: No static subluxation. Facets are aligned. Occipital condyles are normally positioned. Skull base and vertebrae: No acute fracture. Soft tissues and spinal canal: No prevertebral fluid or swelling. No visible canal hematoma. Disc levels: No advanced spinal canal or neural foraminal stenosis. Upper chest: Large pleural effusions. Other: Normal visualized paraspinal cervical soft tissues. IMPRESSION: 1. Generalized atrophy and sequelae of chronic ischemic microangiopathy without acute intracranial abnormality. 2. No acute fracture or static subluxation of the cervical spine. 3. Large pleural effusions. Electronically Signed   By: Ulyses Jarred M.D.   On: 04/28/18 03:24   Ct Head Wo Contrast  Result Date: 04/05/2018 CLINICAL DATA:  Sudden onset new tremors tonight. Altered level of consciousness. Increasing generalized weakness. EXAM: CT HEAD WITHOUT CONTRAST TECHNIQUE: Contiguous axial images were obtained from the base of the skull through the vertex without intravenous contrast. COMPARISON:  MRI brain 10/23/2016 FINDINGS: Brain: Diffuse cerebral atrophy. Mild ventricular dilatation consistent with central atrophy. Low-attenuation changes in the deep white matter consistent with small vessel ischemia. Old appearing lacunar infarcts in the left thalamus. No mass effect or midline shift. No abnormal extra-axial fluid collections. Gray-white matter junctions are distinct. Basal cisterns are not effaced. No acute intracranial hemorrhage. Vascular: Intracranial arterial vascular calcifications are present. Skull: Calvarium appears intact.  Sinuses/Orbits: Paranasal sinuses are clear. Postoperative changes in the mastoids bilaterally. No effusions. Other: None. IMPRESSION: No acute intracranial abnormalities. Chronic atrophy and small vessel ischemic changes. Electronically Signed   By: Lucienne Capers M.D.   On: 04/05/2018 23:56   Ct Chest Wo Contrast  Result Date: 04-28-18 CLINICAL DATA:  Shortness of breath and productive cough. Fall. EXAM: CT CHEST WITHOUT CONTRAST TECHNIQUE: Multidetector CT imaging  of the chest was performed following the standard protocol without IV contrast. COMPARISON:  10/08/2017 FINDINGS: Cardiovascular: Cardiac enlargement. Small pericardial effusions. Coronary artery calcifications. Dilated ascending thoracic aorta at 4 cm maximal diameter. No change. Aortic calcifications are present. Mediastinum/Nodes: Esophagus is decompressed. No significant lymphadenopathy in the chest. Lungs/Pleura: Motion artifact limits examination. Moderate bilateral pleural effusions are present. Larger on the right. Patchy peribronchovascular and nodular ground-glass infiltrates in the lungs. This is likely to represent multifocal pneumonia. Airways are patent with scattered areas of bronchiectasis present. No pneumothorax. Upper Abdomen: No acute abnormality. Musculoskeletal: No chest wall mass or suspicious bone lesions identified. IMPRESSION: 1. Cardiac enlargement with small pericardial effusion. 2. Moderate bilateral pleural effusions, larger on the right. 3. Patchy peribronchovascular and nodular ground-glass infiltrates in the lungs likely representing multifocal pneumonia. 4. Scattered bronchiectasis. 5. 4 cm diameter ascending thoracic aorta. Recommend annual imaging followup by CTA or MRA. This recommendation follows 2010 ACCF/AHA/AATS/ACR/ASA/SCA/SCAI/SIR/STS/SVM Guidelines for the Diagnosis and Management of Patients with Thoracic Aortic Disease. 2010; 121: [id number]. Electronically Signed   By: Lucienne Capers M.D.   On:  05-19-18 03:38   Ct Cervical Spine Wo Contrast  Result Date: 2018/05/19 CLINICAL DATA:  Head trauma, fall EXAM: CT HEAD WITHOUT CONTRAST CT CERVICAL SPINE WITHOUT CONTRAST TECHNIQUE: Multidetector CT imaging of the head and cervical spine was performed following the standard protocol without intravenous contrast. Multiplanar CT image reconstructions of the cervical spine were also generated. COMPARISON:  Head CT 04/05/2018 FINDINGS: CT HEAD FINDINGS Brain: There is no mass, hemorrhage or extra-axial collection. There is generalized atrophy without lobar predilection. There is hypoattenuation of the periventricular white matter, most commonly indicating chronic ischemic microangiopathy. Old left basal ganglia lacunar infarct. Vascular: No abnormal hyperdensity of the major intracranial arteries or dural venous sinuses. No intracranial atherosclerosis. Skull: The visualized skull base, calvarium and extracranial soft tissues are normal. Sinuses/Orbits: No fluid levels or advanced mucosal thickening of the visualized paranasal sinuses. No mastoid or middle ear effusion. The orbits are normal. CT CERVICAL SPINE FINDINGS Alignment: No static subluxation. Facets are aligned. Occipital condyles are normally positioned. Skull base and vertebrae: No acute fracture. Soft tissues and spinal canal: No prevertebral fluid or swelling. No visible canal hematoma. Disc levels: No advanced spinal canal or neural foraminal stenosis. Upper chest: Large pleural effusions. Other: Normal visualized paraspinal cervical soft tissues. IMPRESSION: 1. Generalized atrophy and sequelae of chronic ischemic microangiopathy without acute intracranial abnormality. 2. No acute fracture or static subluxation of the cervical spine. 3. Large pleural effusions. Electronically Signed   By: Ulyses Jarred M.D.   On: 2018/05/19 03:24    EKG: Atrial fibrillation with rapid ventricular rate nonspecific ST changes  Weights: Filed Weights   2018-05-19  0205  Weight: 58.9 kg     Physical Exam: Blood pressure 97/65, pulse (!) 104, temperature 98.9 F (37.2 C), temperature source Oral, resp. rate (!) 27, height 5\' 4"  (1.626 m), weight 58.9 kg, SpO2 96 %. Body mass index is 22.29 kg/m. General: Well developed, well nourished, in no acute distress. Head eyes ears nose throat: Normocephalic, atraumatic, sclera non-icteric, no xanthomas, nares are without discharge. No apparent thyromegaly and/or mass  Lungs: Normal respiratory effort.  Some wheezes, no rales, few rhonchi.  Heart: Irregular with normal S1 S2. no murmur gallop, no rub, PMI is normal size and placement, carotid upstroke normal without bruit, jugular venous pressure is normal Abdomen: Soft, non-tender, non-distended with normoactive bowel sounds. No hepatomegaly. No rebound/guarding. No obvious abdominal masses. Abdominal aorta  is normal size without bruit Extremities: Trace edema. no cyanosis, no clubbing, no ulcers  Peripheral : 2+ bilateral upper extremity pulses, 2+ bilateral femoral pulses, 2+ bilateral dorsal pedal pulse Neuro: Alert and oriented. No facial asymmetry. No focal deficit. Moves all extremities spontaneously. Musculoskeletal: Normal muscle tone without kyphosis Psych:  Responds to questions appropriately with a normal affect.    Assessment: 85 year old male with acute on chronic diastolic dysfunction heart failure with demand ischemia without evidence of acute coronary syndrome having atrial fibrillation with rapid ventricular rate shortness of breath and pulmonary edema and pleural effusion  Plan: 1.  Continue supportive care of shortness of breath and lung issues with cough and congestion 2.  Increase metoprolol for heart rate control with a goal heart rate between 60 and 90 bpm 3.  Anticoagulation for further risk reduction in stroke with atrial fibrillation as able and will switch to oral medication management as able 4.  Diuresis with intravenous Lasix for  acute on chronic diastolic dysfunction heart failure 5.  Further adjustments of medication management after above Further Signed, Corey Skains M.D. [facility] 01-May-2018, 8:54 AM

## 2018-05-13 NOTE — ED Notes (Signed)
ED TO INPATIENT HANDOFF REPORT  ED Nurse Name and Phone #:  Jerald Kief, RN [phone]  S Name/Age/Gender Abelino Derrick 85 y.o. male Room/Bed: ED12A/ED12A  Code Status   Code Status: Partial Code  Home/SNF/Other Home Patient oriented to: self, place, time and situation Is this baseline? Yes   Triage Complete: Triage complete  Chief Complaint poss fever, cough  Triage Note Pt to ED via EMS from home. Pt states he was sitting on end of bed and ended up on the floor. Pt states he does not know how he got on floor, denies hitting head. Pt is on blood thinner xarelto. Pt has multiple brusies on right side from fall 3 weeks ago. NAD. VSS.   Allergies Allergies  Allergen Reactions  . Dexlansoprazole Diarrhea  . Escitalopram Diarrhea  . Lansoprazole Diarrhea  . Shrimp [Shellfish Allergy]     Level of Care/Admitting Diagnosis ED Disposition    ED Disposition Condition [facility] Area: [place] [[id number]]  Level of Care: Stepdown [14]  Diagnosis: Acute on chronic diastolic CHF (congestive heart failure) [facility]) [[id number]]  Admitting Physician: Christel Mormon [[id number]]  Attending Physician: Christel Mormon [[id number]]  Estimated length of stay: 3 - 4 days  Certification:: I certify this patient will need inpatient services for at least 2 midnights  PT Class (Do Not Modify): Inpatient [101]  PT Acc Code (Do Not Modify): Private [1]       B Medical/Surgery History Past Medical History:  Diagnosis Date  . A-fib ([place])    a. new onset 05/2015; b. CHADS2VASC => 4 (CHF, HTN, age x 2)-->Xarelto initiated-->converted to sinus w/ pac's  . Cancer ([place])    skin ca  . Chronic diastolic CHF (congestive heart failure) ([place])    a. echo 05/2015: Ef 50-55%, rhythm Afib, moderately dilated aortic root, mild AI, moderate MR, LA normal in size, PASP 44 mmHg  . GERD (gastroesophageal reflux disease)   . Hypertension   . Rhinitis    Past Surgical  History:  Procedure Laterality Date  . COLONOSCOPY WITH PROPOFOL N/A 09/05/2014   Procedure: COLONOSCOPY WITH PROPOFOL;  Surgeon: Manya Silvas, MD;  Location: [facility] ENDOSCOPY;  Service: Endoscopy;  Laterality: N/A;  . EYE SURGERY     retinal detatchment  . HERNIA REPAIR    . INNER EAR SURGERY       A IV Location/Drains/Wounds Patient Lines/Drains/Airways Status   Active Line/Drains/Airways    Name:   Placement date:   Placement time:   Site:   Days:   Peripheral IV May 11, 2018 Right Antecubital   2018-05-11    0242    Antecubital   less than 1   Peripheral IV 05/11/18 Left Antecubital   11-May-2018    0410    Antecubital   less than 1          Intake/Output Last 24 hours No intake or output data in the 24 hours ending May 11, 2018 0505  Labs/Imaging Results for orders placed or performed during the hospital encounter of 11-May-2018 (from the past 48 hour(s))  CBC with Differential/Platelet     Status: Abnormal   Collection Time: 05-11-18  2:43 AM  Result Value Ref Range   WBC 8.6 4.0 - 10.5 K/uL   RBC 4.15 (L) 4.22 - 5.81 MIL/uL   Hemoglobin 12.5 (L) 13.0 - 17.0 g/dL   HCT 37.8 (L) 39.0 - 52.0 %   MCV 91.1 80.0 - 100.0 fL   MCH 30.1  26.0 - 34.0 pg   MCHC 33.1 30.0 - 36.0 g/dL   RDW 14.2 11.5 - 15.5 %   Platelets 130 (L) 150 - 400 K/uL   nRBC 0.0 0.0 - 0.2 %   Neutrophils Relative % 83 %   Neutro Abs 7.2 1.7 - 7.7 K/uL   Lymphocytes Relative 5 %   Lymphs Abs 0.4 (L) 0.7 - 4.0 K/uL   Monocytes Relative 11 %   Monocytes Absolute 1.0 0.1 - 1.0 K/uL   Eosinophils Relative 0 %   Eosinophils Absolute 0.0 0.0 - 0.5 K/uL   Basophils Relative 0 %   Basophils Absolute 0.0 0.0 - 0.1 K/uL   Immature Granulocytes 1 %   Abs Immature Granulocytes 0.04 0.00 - 0.07 K/uL    Comment: Performed at [facility], [address] [place], [place] [address]  Protime-INR     Status: Abnormal   Collection Time: 2018/05/12  2:43 AM  Result Value Ref Range   Prothrombin Time 65.3 (H) 11.4 -  15.2 seconds   INR 7.9 (HH) 0.8 - 1.2    Comment: RESULT REPEATED AND VERIFIED CRITICAL RESULT CALLED TO, READ BACK BY AND VERIFIED WITH: ALLY Chyna Kneece ON 2018-05-12 AT 0322 QSD (NOTE) INR goal varies based on device and disease states. Performed at [facility], [address] [place], [place] [address]   APTT     Status: Abnormal   Collection Time: 05-12-18  2:43 AM  Result Value Ref Range   aPTT 61 (H) 24 - 36 seconds    Comment:        IF BASELINE aPTT IS ELEVATED, SUGGEST PATIENT RISK ASSESSMENT BE USED TO DETERMINE APPROPRIATE ANTICOAGULANT THERAPY. Performed at [facility], [address] [place], [place] [address]   Troponin I - ONCE - STAT     Status: Abnormal   Collection Time: 05-12-18  2:43 AM  Result Value Ref Range   Troponin I 0.19 (HH) <0.03 ng/mL    Comment: CRITICAL RESULT CALLED TO, READ BACK BY AND VERIFIED WITH ALLIE Saunders [place] 05-12-18 0314 KBH Performed at [facility], [address] [place], [place] [address]   Comprehensive metabolic panel     Status: Abnormal   Collection Time: May 12, 2018  2:43 AM  Result Value Ref Range   Sodium 133 (L) 135 - 145 mmol/L   Potassium 4.4 3.5 - 5.1 mmol/L   Chloride 102 98 - 111 mmol/L   CO2 21 (L) 22 - 32 mmol/L   Glucose, Bld 136 (H) 70 - 99 mg/dL   BUN 44 (H) 8 - 23 mg/dL   Creatinine, Ser 1.11 0.61 - 1.24 mg/dL   Calcium 8.3 (L) 8.9 - 10.3 mg/dL   Total Protein 6.5 6.5 - 8.1 g/dL   Albumin 3.6 3.5 - 5.0 g/dL   AST 145 (H) 15 - 41 U/L   ALT 123 (H) 0 - 44 U/L   Alkaline Phosphatase 88 38 - 126 U/L   Total Bilirubin 2.1 (H) 0.3 - 1.2 mg/dL   GFR calc non Af Amer >60 >60 mL/min   GFR calc Af Amer >60 >60 mL/min   Anion gap 10 5 - 15    Comment: Performed at [facility], [address], [place], [place] [address]  Magnesium     Status: None   Collection Time: 12-May-2018  2:43 AM  Result Value Ref Range   Magnesium 2.0 1.7 - 2.4 mg/dL    Comment: Performed at [facility],  [place], [place] [address]  Brain natriuretic peptide     Status: Abnormal   Collection Time: 21-May-2018  2:43 AM  Result Value Ref Range   B Natriuretic Peptide 3,090.0 (H) 0.0 - 100.0 pg/mL    Comment: Performed at [facility], [address] [place], [place] [address]  Lactic acid, plasma     Status: Abnormal   Collection Time: 05/21/18  4:11 AM  Result Value Ref Range   Lactic Acid, Venous 2.6 (HH) 0.5 - 1.9 mmol/L    Comment: CRITICAL RESULT CALLED TO, READ BACK BY AND VERIFIED WITH ALLIE Wai Minotti 05/21/18 [place] Performed at [facility], [address] [place], [place] [address]   Procalcitonin     Status: None   Collection Time: 05-21-2018  4:15 AM  Result Value Ref Range   Procalcitonin 0.24 ng/mL    Comment:        Interpretation: PCT (Procalcitonin) <= 0.5 ng/mL: Systemic infection (sepsis) is not likely. Local bacterial infection is possible. (NOTE)       Sepsis PCT Algorithm           Lower Respiratory Tract                                      Infection PCT Algorithm    ----------------------------     ----------------------------         PCT < 0.25 ng/mL                PCT < 0.10 ng/mL         Strongly encourage             Strongly discourage   discontinuation of antibiotics    initiation of antibiotics    ----------------------------     -----------------------------       PCT 0.25 - 0.50 ng/mL            PCT 0.10 - 0.25 ng/mL               OR       >80% decrease in PCT            Discourage initiation of                                            antibiotics      Encourage discontinuation           of antibiotics    ----------------------------     -----------------------------         PCT >= 0.50 ng/mL              PCT 0.26 - 0.50 ng/mL               AND        <80% decrease in PCT             Encourage initiation of                                             antibiotics       Encourage continuation            of antibiotics    ----------------------------     -----------------------------  PCT >= 0.50 ng/mL                  PCT > 0.50 ng/mL               AND         increase in PCT                  Strongly encourage                                      initiation of antibiotics    Strongly encourage escalation           of antibiotics                                     -----------------------------                                           PCT <= 0.25 ng/mL                                                 OR                                        > 80% decrease in PCT                                     Discontinue / Do not initiate                                             antibiotics Performed at [facility], [address] [place], [place] [address]   Technologist smear review     Status: None   Collection Time: May 11, 2018  4:15 AM  Result Value Ref Range   Tech Review MORPHOLOGY UNREMARKABLE     Comment: NO SCHISTOCYTES SEEN Performed at [facility], [address] [place], [place] [address]    Ct Head Wo Contrast  Result Date: 11-May-2018 CLINICAL DATA:  Head trauma, fall EXAM: CT HEAD WITHOUT CONTRAST CT CERVICAL SPINE WITHOUT CONTRAST TECHNIQUE: Multidetector CT imaging of the head and cervical spine was performed following the standard protocol without intravenous contrast. Multiplanar CT image reconstructions of the cervical spine were also generated. COMPARISON:  Head CT 04/05/2018 FINDINGS: CT HEAD FINDINGS Brain: There is no mass, hemorrhage or extra-axial collection. There is generalized atrophy without lobar predilection. There is hypoattenuation of the periventricular white matter, most commonly indicating chronic ischemic microangiopathy. Old left basal ganglia lacunar infarct. Vascular: No abnormal hyperdensity of the major intracranial arteries or dural venous sinuses. No intracranial atherosclerosis. Skull: The visualized skull base,  calvarium and extracranial soft tissues are normal. Sinuses/Orbits: No fluid levels or advanced mucosal thickening of the visualized paranasal sinuses. No mastoid or middle  ear effusion. The orbits are normal. CT CERVICAL SPINE FINDINGS Alignment: No static subluxation. Facets are aligned. Occipital condyles are normally positioned. Skull base and vertebrae: No acute fracture. Soft tissues and spinal canal: No prevertebral fluid or swelling. No visible canal hematoma. Disc levels: No advanced spinal canal or neural foraminal stenosis. Upper chest: Large pleural effusions. Other: Normal visualized paraspinal cervical soft tissues. IMPRESSION: 1. Generalized atrophy and sequelae of chronic ischemic microangiopathy without acute intracranial abnormality. 2. No acute fracture or static subluxation of the cervical spine. 3. Large pleural effusions. Electronically Signed   By: Ulyses Jarred M.D.   On: May 25, 2018 03:24   Ct Chest Wo Contrast  Result Date: 05-25-2018 CLINICAL DATA:  Shortness of breath and productive cough. Fall. EXAM: CT CHEST WITHOUT CONTRAST TECHNIQUE: Multidetector CT imaging of the chest was performed following the standard protocol without IV contrast. COMPARISON:  10/08/2017 FINDINGS: Cardiovascular: Cardiac enlargement. Small pericardial effusions. Coronary artery calcifications. Dilated ascending thoracic aorta at 4 cm maximal diameter. No change. Aortic calcifications are present. Mediastinum/Nodes: Esophagus is decompressed. No significant lymphadenopathy in the chest. Lungs/Pleura: Motion artifact limits examination. Moderate bilateral pleural effusions are present. Larger on the right. Patchy peribronchovascular and nodular ground-glass infiltrates in the lungs. This is likely to represent multifocal pneumonia. Airways are patent with scattered areas of bronchiectasis present. No pneumothorax. Upper Abdomen: No acute abnormality. Musculoskeletal: No chest wall mass or suspicious bone  lesions identified. IMPRESSION: 1. Cardiac enlargement with small pericardial effusion. 2. Moderate bilateral pleural effusions, larger on the right. 3. Patchy peribronchovascular and nodular ground-glass infiltrates in the lungs likely representing multifocal pneumonia. 4. Scattered bronchiectasis. 5. 4 cm diameter ascending thoracic aorta. Recommend annual imaging followup by CTA or MRA. This recommendation follows 2010 ACCF/AHA/AATS/ACR/ASA/SCA/SCAI/SIR/STS/SVM Guidelines for the Diagnosis and Management of Patients with Thoracic Aortic Disease. 2010; 121: J188-C166. Electronically Signed   By: Lucienne Capers M.D.   On: 2018-05-25 03:38   Ct Cervical Spine Wo Contrast  Result Date: 05/25/18 CLINICAL DATA:  Head trauma, fall EXAM: CT HEAD WITHOUT CONTRAST CT CERVICAL SPINE WITHOUT CONTRAST TECHNIQUE: Multidetector CT imaging of the head and cervical spine was performed following the standard protocol without intravenous contrast. Multiplanar CT image reconstructions of the cervical spine were also generated. COMPARISON:  Head CT 04/05/2018 FINDINGS: CT HEAD FINDINGS Brain: There is no mass, hemorrhage or extra-axial collection. There is generalized atrophy without lobar predilection. There is hypoattenuation of the periventricular white matter, most commonly indicating chronic ischemic microangiopathy. Old left basal ganglia lacunar infarct. Vascular: No abnormal hyperdensity of the major intracranial arteries or dural venous sinuses. No intracranial atherosclerosis. Skull: The visualized skull base, calvarium and extracranial soft tissues are normal. Sinuses/Orbits: No fluid levels or advanced mucosal thickening of the visualized paranasal sinuses. No mastoid or middle ear effusion. The orbits are normal. CT CERVICAL SPINE FINDINGS Alignment: No static subluxation. Facets are aligned. Occipital condyles are normally positioned. Skull base and vertebrae: No acute fracture. Soft tissues and spinal canal: No  prevertebral fluid or swelling. No visible canal hematoma. Disc levels: No advanced spinal canal or neural foraminal stenosis. Upper chest: Large pleural effusions. Other: Normal visualized paraspinal cervical soft tissues. IMPRESSION: 1. Generalized atrophy and sequelae of chronic ischemic microangiopathy without acute intracranial abnormality. 2. No acute fracture or static subluxation of the cervical spine. 3. Large pleural effusions. Electronically Signed   By: Ulyses Jarred M.D.   On: May 25, 2018 03:24    Pending Labs Unresulted Labs (From admission, onward)    Start  Ordered   04/27/18 3818  Basic metabolic panel  Daily,   STAT     05-19-2018 0500   04/27/18 0500  Magnesium  (Magnesium replacement + AM level)  Once,   STAT     2018-05-19 0500   05/19/18 0500  CBC WITH DIFFERENTIAL  Daily,   STAT     2018-05-19 0500   05/19/2018 0442  Influenza panel by PCR (type A & B)  (Influenza PCR Panel)  Once,   STAT     19-May-2018 0441   2018/05/19 0354  Lactic acid, plasma  STAT Now then every 3 hours,   STAT     May 19, 2018 0353   05/19/2018 0354  Blood Culture (routine x 2)  BLOOD CULTURE X 2,   STAT     05/19/18 0353   2018/05/19 0354  Urinalysis, Complete w Microscopic  ONCE - STAT,   STAT     2018-05-19 0353   May 19, 2018 0354  Urine culture  Add-on,   AD     05-19-2018 0353          Vitals/Pain Today's Vitals   05-19-18 0400 05-19-18 0430 19-May-2018 0435 19-May-2018 0445  BP:   102/80 105/73  Pulse: 95 98 99 (!) 57  Resp: (!) 36 (!) 24 (!) 23 (!) 30  Temp:      TempSrc:      SpO2: 99% 98% 100% 100%  Weight:      Height:      PainSc:        Isolation Precautions Droplet precaution  Medications Medications  metoprolol tartrate (LOPRESSOR) injection 5 mg (5 mg Intravenous Given 2018/05/19 0250)  ceFEPIme (MAXIPIME) 2 g in sodium chloride 0.9 % 100 mL IVPB (2 g Intravenous New Bag/Given 05/19/18 0441)  vancomycin (VANCOCIN) 1,500 mg in sodium chloride 0.9 % 500 mL IVPB (1,500 mg Intravenous New  Bag/Given 05-19-2018 0431)  mirtazapine (REMERON) tablet 15 mg (has no administration in time range)  zolpidem (AMBIEN) tablet 5 mg (has no administration in time range)  pantoprazole (PROTONIX) EC tablet 40 mg (has no administration in time range)  Align CAPS 4 mg (has no administration in time range)  tamsulosin (FLOMAX) capsule 0.4 mg (has no administration in time range)  Vitamin B 12 LOZG 1 tablet (has no administration in time range)  cholecalciferol (VITAMIN D3) tablet 3,000 Units (has no administration in time range)  azelastine (ASTELIN) 0.1 % nasal spray 1 spray (has no administration in time range)  loratadine (CLARITIN) tablet 10 mg (has no administration in time range)  montelukast (SINGULAIR) tablet 10 mg (has no administration in time range)  sodium chloride flush (NS) 0.9 % injection 3 mL (has no administration in time range)  sodium chloride flush (NS) 0.9 % injection 3 mL (has no administration in time range)  0.9 %  sodium chloride infusion (has no administration in time range)  acetaminophen (TYLENOL) tablet 650 mg (has no administration in time range)  ondansetron (ZOFRAN) injection 4 mg (has no administration in time range)  furosemide (LASIX) injection 40 mg (has no administration in time range)  magnesium sulfate IVPB 2 g 50 mL (has no administration in time range)    Mobility walks with device Low fall risk   Focused Assessments Cardiac Assessment Handoff:  Cardiac Rhythm: Atrial fibrillation Lab Results  Component Value Date   TROPONINI 0.19 (HH) 05-19-2018   No results found for: DDIMER Does the Patient currently have chest pain? No  , Pulmonary Assessment Handoff:  Lung sounds:  O2 Device: Bi-PAP        R Recommendations: See Admitting Provider Note  Report given to:   Additional Notes:   INR greater than 7  BNP greater than 3,000 Trop 0.19 Lactate 2.6  Afib on arrival converted to NSR after 5 mg IV Metoprolol. Frequent PVC's.   Pt  does not want CPR performed, is undecided about intubation at this time. Son is at the bedside and has POA.  Tolerating BIPAP well.

## 2018-05-13 NOTE — ED Notes (Signed)
Allison RN, aware of bed assigned  

## 2018-05-13 NOTE — Consult Note (Addendum)
PULMONARY / CRITICAL CARE MEDICINE  Name: Derek James MRN: [id number] DOB: Jun 14, 1930    LOS: 0  Referring Provider: Dr. Sidney Ace Reason for Referral: Acute hypoxic respiratory failure Brief patient description: 85 year old male admitted with an unwitnessed fall at home, acute CHF exacerbation, community-acquired pneumonia, A. fib with RVR, and elevated INR   HPI: This is an 85 year old Caucasian male with medical history as indicated below who presented to the ED following a fall/syncopal episode at home.  Patient does not recall any of the events that led to his hospitalization.  Stress obtained from ED records and from patient's son who was present at the time of assessment.  Patient's son indicates that patient fell while taking off his pants.  Prior to the fall, patient had been dyspneic with a productive cough and sputum x4 days.  He also had worsening lower extremity edema and was seen by his PCP and started on Lasix.  He is on Xarelto for atrial fibrillation and prior to this incident patient states that he has not had any falls in a while.  At the ED, patient was afebrile, tachycardic with heart rate in the 120s, and his EKG showed A. fib with RVR with a rate of 139.  He remained tachypneic and hypoxic hence he was placed on BiPAP.  His CT head was negative as well as his CT spine however his CT chest showed bilateral pleural effusions right greater than left, and nodular groundglass opacities in both lungs suspicious for multifocal pneumonia.  His proBNP was greater than 3000, troponin of 0.19, INR of 7.9 and a lactic acid of 2.6.  He denies chest pain, palpitations, dizziness and headache.  He is being admitted to the ICU for further management.  Past Medical History:  Diagnosis Date  . A-fib ([place])    a. new onset 05/2015; b. CHADS2VASC => 4 (CHF, HTN, age x 2)-->Xarelto initiated-->converted to sinus w/ pac's  . Cancer ([place])    skin ca  . Chronic diastolic CHF (congestive heart  failure) ([place])    a. echo 05/2015: Ef 50-55%, rhythm Afib, moderately dilated aortic root, mild AI, moderate MR, LA normal in size, PASP 44 mmHg  . GERD (gastroesophageal reflux disease)   . Hypertension   . Rhinitis    Past Surgical History:  Procedure Laterality Date  . COLONOSCOPY WITH PROPOFOL N/A 09/05/2014   Procedure: COLONOSCOPY WITH PROPOFOL;  Surgeon: Manya Silvas, MD;  Location: [facility] ENDOSCOPY;  Service: Endoscopy;  Laterality: N/A;  . EYE SURGERY     retinal detatchment  . HERNIA REPAIR    . INNER EAR SURGERY     Prior to Admission medications   Medication Sig Start Date End Date Taking? Authorizing Provider  amLODipine (NORVASC) 5 MG tablet Take 5 mg by mouth daily.   Yes [provider]  clopidogrel (PLAVIX) 75 MG tablet Take 75 mg by mouth daily.   Yes [provider]  donepezil (ARICEPT) 5 MG tablet Take 1 tablet (5 mg total) by mouth at bedtime. 10/06/17 11/15/17 Yes Sowles, Drue Stager, MD  empagliflozin (JARDIANCE) 25 MG TABS tablet Take 25 mg by mouth daily.   Yes [provider]  glycopyrrolate (ROBINUL) 1 MG tablet Take 1 mg by mouth 2 (two) times daily.   Yes [provider]  insulin aspart (NOVOLOG FLEXPEN) 100 UNIT/ML FlexPen Inject 12 Units into the skin 2 (two) times daily.   Yes [provider]  insulin aspart (NOVOLOG) 100 UNIT/ML FlexPen Inject 18 Units into  the skin daily. At 1700   Yes [provider]  Insulin Degludec-Liraglutide (XULTOPHY) 100-3.6 UNIT-MG/ML SOPN Inject 50 Units into the skin daily.   Yes [provider]  levETIRAcetam (KEPPRA) 500 MG tablet Take 500 mg by mouth 2 (two) times daily.   Yes [provider]  lipase/protease/amylase (CREON) 12000 units CPEP capsule Take 6,000 Units by mouth 3 (three) times daily before meals.   Yes [provider]  lipase/protease/amylase (CREON) 12000 units CPEP capsule Take 3,000 Units by mouth at bedtime. With snack   Yes [provider]  lisinopril (PRINIVIL,ZESTRIL) 5 MG tablet Take 5 mg by mouth daily.   Yes [provider]  metoprolol succinate (TOPROL-XL) 25 MG 24 hr tablet Take 1 tablet (25 mg total) by mouth daily. 10/06/17  Yes Sowles, Drue Stager, MD  rosuvastatin (CRESTOR) 40 MG tablet Take 1 tablet (40 mg total) by mouth daily. 10/06/17 11/15/17 Yes Steele Sizer, MD  aspirin EC 81 MG tablet Take 81 mg by mouth daily.    [provider]  famotidine (PEPCID) 20 MG tablet Take 1 tablet (20 mg total) by mouth 2 (two) times daily. 10/06/17 11/05/17  Steele Sizer, MD  gabapentin (NEURONTIN) 300 MG capsule Take 1 capsule (300 mg total) by mouth 2 (two) times daily. 10/06/17 11/05/17  Steele Sizer, MD  insulin glargine (LANTUS) 100 UNIT/ML injection Inject 0.1 mLs (10 Units total) into the skin daily. 10/06/17 11/05/17  Steele Sizer, MD  lacosamide 100 MG TABS Take 1 tablet (100 mg total) by mouth 2 (two) times daily. Patient not taking: Reported on 11/15/2017 02/21/17   Fritzi Mandes, MD  promethazine (PHENERGAN) 12.5 MG tablet Take 1 tablet (12.5 mg total) by mouth every 6 (six) hours as needed for nausea or vomiting. Patient not taking: Reported on 11/15/2017 12/10/16   Stark Klein, MD  sertraline (ZOLOFT) 25 MG tablet Take 1 tablet (25 mg total) by mouth daily. Patient not taking: Reported on 11/15/2017 10/06/17   Steele Sizer, MD   Allergies Allergies  Allergen Reactions  . Dexlansoprazole Diarrhea  . Escitalopram Diarrhea  . Lansoprazole Diarrhea  . Shrimp [Shellfish Allergy]     Family History Family History  Problem Relation Age of Onset  . Hypertension Mother   . Hypertension Father   . CAD Father   . Bladder Cancer Neg Hx   . Kidney cancer Neg Hx   . Prostate cancer Neg Hx    Social History  reports that he has never smoked. He has never used smokeless tobacco. He reports that he does not drink alcohol or use drugs.  Review Of Systems:   Constitutional: Negative for  fever and chills.  HENT: Negative for congestion and rhinorrhea.  Eyes: Negative for redness and visual disturbance.  Respiratory: Positive for shortness of breath but negative for wheezing.  Cardiovascular: Negative for chest pain and palpitations but positive for lower extremity edema.  Gastrointestinal: Negative  for nausea , vomiting and abdominal pain and  Loose stools Genitourinary: Negative for dysuria and urgency.  Endocrine: Denies polyuria, polyphagia and heat intolerance Musculoskeletal: Negative for myalgias and arthralgias.  Skin: Negative for pallor and wound.  Neurological: Negative for dizziness and headaches   VITAL SIGNS: BP 97/65   Pulse 92   Temp 98.9 F (37.2 C) (Oral)   Resp (!) 32   Ht 5\' 4"  (1.626 m)   Wt 58.9 kg   SpO2 94%   BMI 22.29 kg/m   HEMODYNAMICS:    VENTILATOR SETTINGS: FiO2 (%):  [  45 %] 45 %  INTAKE / OUTPUT: No intake/output data recorded.  PHYSICAL EXAMINATION: General: Moderate respiratory distress HEENT: PERRLA, trachea midline, moderate JVD Neuro: Alert and oriented to person and place, moves all extremities, follows commands Cardiovascular: Apical pulse irregular, mildly tachycardic, S1-S2, no murmur regurg or gallop, +2 pulses bilaterally, +3 pitting edema bilateral Lungs: Bilateral breath sounds, diminished in the bases with bibasilar crackles and rails Abdomen: Nondistended, normal bowel sounds in all 4 quadrants, palpation reveals no organomegaly Musculoskeletal: Positive range of motion, no joint deformities Skin: Warm and dry  LABS:  BMET Recent Labs  Lab May 05, 2018 0243  NA 133*  K 4.4  CL 102  CO2 21*  BUN 44*  CREATININE 1.11  GLUCOSE 136*    Electrolytes Recent Labs  Lab May 05, 2018 0243  CALCIUM 8.3*  MG 2.0    CBC Recent Labs  Lab 05-May-2018 0243  WBC 8.6  HGB 12.5*  HCT 37.8*  PLT 130*    Coag's Recent Labs  Lab May 05, 2018 0243  APTT 61*  INR 7.9*    Sepsis Markers Recent Labs  Lab  2018-05-05 0411 05-05-18 0415  LATICACIDVEN 2.6*  --   PROCALCITON  --  0.24    ABG No results for input(s): PHART, PCO2ART, PO2ART in the last 168 hours.  Liver Enzymes Recent Labs  Lab 2018-05-05 0243  AST 145*  ALT 123*  ALKPHOS 88  BILITOT 2.1*  ALBUMIN 3.6    Cardiac Enzymes Recent Labs  Lab 2018/05/05 0243  TROPONINI 0.19*    Glucose Recent Labs  Lab May 05, 2018 0619  GLUCAP 126*    Imaging Ct Head Wo Contrast  Result Date: 05-May-2018 CLINICAL DATA:  Head trauma, fall EXAM: CT HEAD WITHOUT CONTRAST CT CERVICAL SPINE WITHOUT CONTRAST TECHNIQUE: Multidetector CT imaging of the head and cervical spine was performed following the standard protocol without intravenous contrast. Multiplanar CT image reconstructions of the cervical spine were also generated. COMPARISON:  Head CT 04/05/2018 FINDINGS: CT HEAD FINDINGS Brain: There is no mass, hemorrhage or extra-axial collection. There is generalized atrophy without lobar predilection. There is hypoattenuation of the periventricular white matter, most commonly indicating chronic ischemic microangiopathy. Old left basal ganglia lacunar infarct. Vascular: No abnormal hyperdensity of the major intracranial arteries or dural venous sinuses. No intracranial atherosclerosis. Skull: The visualized skull base, calvarium and extracranial soft tissues are normal. Sinuses/Orbits: No fluid levels or advanced mucosal thickening of the visualized paranasal sinuses. No mastoid or middle ear effusion. The orbits are normal. CT CERVICAL SPINE FINDINGS Alignment: No static subluxation. Facets are aligned. Occipital condyles are normally positioned. Skull base and vertebrae: No acute fracture. Soft tissues and spinal canal: No prevertebral fluid or swelling. No visible canal hematoma. Disc levels: No advanced spinal canal or neural foraminal stenosis. Upper chest: Large pleural effusions. Other: Normal visualized paraspinal cervical soft tissues. IMPRESSION:  1. Generalized atrophy and sequelae of chronic ischemic microangiopathy without acute intracranial abnormality. 2. No acute fracture or static subluxation of the cervical spine. 3. Large pleural effusions. Electronically Signed   By: Ulyses Jarred M.D.   On: 05-05-18 03:24   Ct Chest Wo Contrast  Result Date: 05/05/18 CLINICAL DATA:  Shortness of breath and productive cough. Fall. EXAM: CT CHEST WITHOUT CONTRAST TECHNIQUE: Multidetector CT imaging of the chest was performed following the standard protocol without IV contrast. COMPARISON:  10/08/2017 FINDINGS: Cardiovascular: Cardiac enlargement. Small pericardial effusions. Coronary artery calcifications. Dilated ascending thoracic aorta at 4 cm maximal diameter. No change. Aortic calcifications are present. Mediastinum/Nodes:  Esophagus is decompressed. No significant lymphadenopathy in the chest. Lungs/Pleura: Motion artifact limits examination. Moderate bilateral pleural effusions are present. Larger on the right. Patchy peribronchovascular and nodular ground-glass infiltrates in the lungs. This is likely to represent multifocal pneumonia. Airways are patent with scattered areas of bronchiectasis present. No pneumothorax. Upper Abdomen: No acute abnormality. Musculoskeletal: No chest wall mass or suspicious bone lesions identified. IMPRESSION: 1. Cardiac enlargement with small pericardial effusion. 2. Moderate bilateral pleural effusions, larger on the right. 3. Patchy peribronchovascular and nodular ground-glass infiltrates in the lungs likely representing multifocal pneumonia. 4. Scattered bronchiectasis. 5. 4 cm diameter ascending thoracic aorta. Recommend annual imaging followup by CTA or MRA. This recommendation follows 2010 ACCF/AHA/AATS/ACR/ASA/SCA/SCAI/SIR/STS/SVM Guidelines for the Diagnosis and Management of Patients with Thoracic Aortic Disease. 2010; 121: A416-S063. Electronically Signed   By: Lucienne Capers M.D.   On: 05/14/18 03:38   Ct  Cervical Spine Wo Contrast  Result Date: May 14, 2018 CLINICAL DATA:  Head trauma, fall EXAM: CT HEAD WITHOUT CONTRAST CT CERVICAL SPINE WITHOUT CONTRAST TECHNIQUE: Multidetector CT imaging of the head and cervical spine was performed following the standard protocol without intravenous contrast. Multiplanar CT image reconstructions of the cervical spine were also generated. COMPARISON:  Head CT 04/05/2018 FINDINGS: CT HEAD FINDINGS Brain: There is no mass, hemorrhage or extra-axial collection. There is generalized atrophy without lobar predilection. There is hypoattenuation of the periventricular white matter, most commonly indicating chronic ischemic microangiopathy. Old left basal ganglia lacunar infarct. Vascular: No abnormal hyperdensity of the major intracranial arteries or dural venous sinuses. No intracranial atherosclerosis. Skull: The visualized skull base, calvarium and extracranial soft tissues are normal. Sinuses/Orbits: No fluid levels or advanced mucosal thickening of the visualized paranasal sinuses. No mastoid or middle ear effusion. The orbits are normal. CT CERVICAL SPINE FINDINGS Alignment: No static subluxation. Facets are aligned. Occipital condyles are normally positioned. Skull base and vertebrae: No acute fracture. Soft tissues and spinal canal: No prevertebral fluid or swelling. No visible canal hematoma. Disc levels: No advanced spinal canal or neural foraminal stenosis. Upper chest: Large pleural effusions. Other: Normal visualized paraspinal cervical soft tissues. IMPRESSION: 1. Generalized atrophy and sequelae of chronic ischemic microangiopathy without acute intracranial abnormality. 2. No acute fracture or static subluxation of the cervical spine. 3. Large pleural effusions. Electronically Signed   By: Ulyses Jarred M.D.   On: 05/14/2018 03:24    STUDIES:  2D echo  CULTURES: Blood cultures x2 Respiratory panel pending  ANTIBIOTICS: Vancomycin and cefepime given in the  ED VANCOMYCIN> ZOSYN>  SIGNIFICANT EVENTS: 2018-05-14: Admitted  LINES/TUBES: Peripheral IVs  DISCUSSION: 85 year old male presenting with acute hypoxic respiratory failure secondary to acute CHF exacerbation, elevated INR, questionable multifocal pneumonia versus pulmonary edema, A. fib with RVR and fall/syncope  ASSESSMENT Acute hypoxic respiratory failure Unwitnessed fall and syncope Acute CHF exacerbation Bilateral pleural effusions Multifocal pneumonia- doubt infectious, CT presentation more consistent with pulmonary edema Atrial fibrillation with RVR Elevated LFTs- likely due to heart failure and volume overload Thrombocytopenia   PLAN Titrated off BiPAP to high flow nasal cannula for comfort ABG and chest x-ray as needed Strict fall precautions IV diuresis Antibiotics as above and discontinue if procalcitonin is normal Trend creatinine Monitor and replace electrolytes 2D echo Cardiology consult pending Trend LFTs Strict I's and O's monitoring Foley catheter to gravity Follow-up respiratory panel Monitor for bleeding and trend INR.   Will consider FFP and vitamin K if bleeding occurs  Best Practice: Code Status: DNI Diet: Heart healthy GI prophylaxis: Not indicated  VTE prophylaxis: Supratherapeutic INR  FAMILY  - Updates: Patient and his son updated at [place]. [facility] ANP-BC Pulmonary and Critical Care Medicine [facility] Pager [phone] or [phone]  NB: This document was prepared using Dragon voice recognition software and may include unintentional dictation errors.    May 12, 2018, 6:57 AM

## 2018-05-13 NOTE — ED Notes (Signed)
Pt not improving with [place], placed on NRB at this time.

## 2018-05-13 NOTE — Progress Notes (Signed)
Pharmacy Antibiotic Note  Derek James is a 85 y.o. male admitted on 2018/04/28 with pneumonia.  Pharmacy has been consulted for vanc/zosyn dosing.  Plan: Patient received vanc 1.5g IV load and cefepime 2g IV in ED  Vancomycin 750 mg IV Q 24 hrs. Goal AUC 400-550. Expected AUC: 462.5 SCr used: 1.11 mg/dL Cssmin: 12.2 mcg/mL  Will continue zosyn 3.375g IV q8h  Height: 5\' 4"  (162.6 cm) Weight: 129 lb 13.6 oz (58.9 kg) IBW/kg (Calculated) : 59.2  Temp (24hrs), Avg:98.9 F (37.2 C), Min:98.9 F (37.2 C), Max:98.9 F (37.2 C)  Recent Labs  Lab 28-Apr-2018 0243 04-28-18 0411  WBC 8.6  --   CREATININE 1.11  --   LATICACIDVEN  --  2.6*    Estimated Creatinine Clearance: 40.5 mL/min (by C-G formula based on SCr of 1.11 mg/dL).    Allergies  Allergen Reactions  . Dexlansoprazole Diarrhea  . Escitalopram Diarrhea  . Lansoprazole Diarrhea  . Shrimp [Shellfish Allergy]     Thank you for allowing pharmacy to be a part of this patient's care.  Tobie Lords, PharmD, BCPS Clinical Pharmacist 2018/04/28

## 2018-05-13 NOTE — Progress Notes (Signed)
Same day progress note  #1.  Acute on top of chronic diastolic CHF with subsequent acute hypoxic respiratory failure.  - continue IV Lasix 40 mg BID. neg serial cardiac enzymes. pending 2D echo - last one was in 2017 revealing an EF of 55% - appreciate cardiology Dr. Nehemiah Massed input.  We will continue BiPAP as needed and taper off as tolerated.    2.  Multifocal pneumonia, contributing to his acute hypoxic respiratory failure - continue IV vancomycin and Zosyn.  Given his bilateral groundglass densities we will obtain respiratory viral panel by PCR.  on mucolytic therapy with Mucinex and PRN duo nebs. - pending blood cultures and obtain sputum Gram stain culture and sensitivity.  An ID consultation will be obtained by Dr. Delaine Lame.  3.  Atrial fibrillation with rapid ventricular response. continue IV Lopressor and his rate will be monitored.  4.  Coagulopathy.  This is only secondary to Xarelto that will be held off.  Will follow INR.  The patient's hemoglobin and hematocrit are fairly stable.  5.  Syncope and fall without injuries.  This is likely secondary to #1, 2 and 3, mainly his atrial fibrillation and associated hypoxia with acute respiratory failure.   6.  Hypertension.  His blood pressure is fairly controlled.  7.  GERD.  PPI therapy   8.  DVT prophylaxis.  The patient is currently coagulopathic therefore no medical prophylaxis or mechanical prophylaxis will be provided.  Time spent: 15 mins

## 2018-05-13 NOTE — Progress Notes (Signed)
Amio bolus hung at 1326, pt received approximately 68mL of same when he became bradycardic and ultimately pulseless. Pt's code status was "no chest compressions". Dr. Alva Garnet at the bedside. Pt became apneic within seconds and no intubation preformed. Pt pronounced dead at 1340. Pt's family made aware via phone by Dr. Alva Garnet.

## 2018-05-13 NOTE — Progress Notes (Signed)
Patient was transported to icu by rt on bipap. Dr Sidney Ace requested patient be transitioned off bipap to another mode of therapy due to discomfort. Patient had requested to take mask off prior to leaving ed. Patient was transitioned to a heated high flow nasal cannula by therapist covering icu. Bipap on standby.

## 2018-05-13 NOTE — Discharge Summary (Signed)
DEATH SUMMARY  DATE OF ADMISSION:  May 23, 2018  DATE OF DISCHARGE/DEATH: 05/23/18   ADMISSION DIAGNOSES:   Acute/chronic diastolic CHF Acute hypoxic respiratory failure Acute pulmonary edema Possible community-acquired pneumonia Coagulopathy Syncope History of hypertension History of GERD Limited CODE STATUS   DISCHARGE DIAGNOSES:   Severe dilated cardiomyopathy Paroxysmal atrial fibrillation Atrial fibrillation with RVR Dilated ascending thoracic aortic aneurysm Acute hypoxic respiratory failure Acute pulmonary edema Possible community-acquired pneumonia Coagulopathy due to Xarelto Syncope with contusion to R shoulder and R side of face History of hypertension, controlled History of GERD Sudden brady-asystolic cardiac arrest   PRESENTATION:   Pt was admitted with the following HPI and the above admission diagnoses:  Derek James  is a 85 y.o. male with a known history of diastolic CHF, atrial fibrillation, GERD and hypertension who presented to the emergency room with acute onset of fall likely secondary to syncopal episode as the patient does not remember everything.  His son stated that he was taking off his pants to change to his pajamas when this happened.  He has been having dyspnea as well as cough productive of greenish-brown sputum over the last 4 days.  He is also been having worsening lower extremity edema with orthopnea and paroxysmal nocturnal dyspnea over the last 5 days.  He was recently given Lasix that he used over the last 3 days.  No chest pain or palpitations.  No nausea or vomiting or abdominal pain.  No dysuria, oliguria or hematuria or flank pain.  He did not have any abdominal pain or melena or bright red bleeding per rectum.  No other bleeding diathesis.  According to his son his temperature when EMS arrived was 100.8 but he was afebrile here in the ER with a temperature of 98.9.  Upon presentation to the emergency room, he was tachycardic with a heart  rate of 122 and his EKG showed atrial fibrillation with rapid ventricular response of 139 with left axis deviation and Q waves in V1 through V3.  He became more tachypneic with respiratory to 33 and tachycardic and his pulse continues dropped to the 40s.  He was placed on 100% nonrebreather and later BiPAP with clinical improvement.  His head CT scan as well as C-spine CT showed no acute abnormalities as mentioned below.  Chest CT without contrast revealed cardiomegaly and small pericardial effusion as well as moderate bilateral pleural effusions, larger on the right with patchy peribronchial and nodular groundglass infiltrates in the lungs likely representing multifocal pneumonia.  There was scattered bronchiectasis and 4 cm diameter of the ascending thoracic aorta with recommendation for annual imaging by CTA or MRA.  The patient was given 5 mg of IV Lopressor and 2 g of IV cefepime as well as IV vancomycin.  He will be admitted to stepdown unit for further evaluation and management.  HOSPITAL COURSE:   He was admitted to ICU/SDU for closer monitoring.  He underwent diuresis with furosemide.  He received broad-spectrum antibiotics.  He received supplemental oxygen.  Initial B natruretic peptide was 3090.  Initial troponin I was 0.19.  Echocardiogram revealed LVEF estimated 30%, LV chamber mildly dilated, LV diastolic parameters indeterminate.  Inferior, inferior lateral and inferior septal akinesis.  LA severely dilated.  RV with mildly reduced systolic function and enlarged RV cavity.  Severe tricuspid regurgitation.  RA moderately dilated.  CT chest without contrast revealed cardiac enlargement with small pericardial effusion, moderate bilateral pleural effusions R >L, patchy peribronchovascular and nodular groundglass infiltrates in the lung, scattered  bronchiectasis, 4 cm diameter ascending thoracic aortic aneurysm  For rapid atrial fibrillation with soft BPs, amiodarone was ordered.  He had  received a small amount of the initial bolus when he developed sudden brady-asystolic pulseless cardiac arrest.  I was called emergently to the bedside.  He was pulseless and had agonal respirations and was profoundly cyanotic despite oxygen administered by high flow nasal cannula.  He was a limited code and I felt that there would be no utility in providing ACLS support without intubation (which he had previously clarified that he would not wish to undergo).  No resuscitative efforts were made at that time. He was pronounced dead @ 1340 on this day of May 09, 2018   Cause of death:  Sudden cardiac death due to severe dilated ischemic cardiomyopathy  Contributing factors: Paroxysmal atrial fibrillation with RVR Acute hypoxemic respiratory failure Acute pulmonary edema  Smoking:  No   Merton Border, MD PCCM service Mobile [phone] Pager [phone] 05-09-18 2:32 PM

## 2018-05-13 NOTE — ED Triage Notes (Signed)
Pt to ED via EMS from home. Pt states he was sitting on end of bed and ended up on the floor. Pt states he does not know how he got on floor, denies hitting head. Pt is on blood thinner xarelto. Pt has multiple brusies on right side from fall 3 weeks ago. NAD. VSS.

## 2018-05-13 NOTE — Consult Note (Addendum)
  Amiodarone Drug - Drug Interaction Consult Note  Recommendations:  ---monitor electrolytes: all wnl today ---monitor HR ---follow QT: latest QTc 450 ms on prn Zofran  Amiodarone is metabolized by the cytochrome P450 system and therefore has the potential to cause many drug interactions. Amiodarone has an average plasma half-life of 50 days (range 20 to 100 days).   There is potential for drug interactions to occur several weeks or months after stopping treatment and the onset of drug interactions may be slow after initiating amiodarone.   []  Statins: Increased risk of myopathy. Simvastatin- restrict dose to 20mg  daily. Other statins: counsel patients to report any muscle pain or weakness immediately.  []  Anticoagulants: Amiodarone can increase anticoagulant effect. Consider warfarin dose reduction. Patients should be monitored closely and the dose of anticoagulant altered accordingly, remembering that amiodarone levels take several weeks to stabilize.  []  Antiepileptics: Amiodarone can increase plasma concentration of phenytoin, the dose should be reduced. Note that small changes in phenytoin dose can result in large changes in levels. Monitor patient and counsel on signs of toxicity.  [x]  Beta blockers: increased risk of bradycardia, AV block and myocardial depression. Sotalol - avoid concomitant use.  []   Calcium channel blockers (diltiazem and verapamil): increased risk of bradycardia, AV block and myocardial depression.  []   Cyclosporine: Amiodarone increases levels of cyclosporine. Reduced dose of cyclosporine is recommended.  []  Digoxin dose should be halved when amiodarone is started.  [x]  Diuretics: increased risk of cardiotoxicity if hypokalemia occurs.  []  Oral hypoglycemic agents (glyburide, glipizide, glimepiride): increased risk of hypoglycemia. Patient's glucose levels should be monitored closely when initiating amiodarone therapy.   [x]  Drugs that prolong the QT  interval:  Torsades de pointes risk may be increased with concurrent use - avoid if possible.  Monitor QTc, also keep magnesium/potassium WNL if concurrent therapy can't be avoided. Marland Kitchen Antibiotics: e.g. fluoroquinolones, erythromycin. . Antiarrhythmics: e.g. quinidine, procainamide, disopyramide, sotalol. . Antipsychotics: e.g. phenothiazines, haloperidol.  . Lithium, tricyclic antidepressants, and methadone.  Thank You,  Dallie Piles , PharmD 05/25/18 12:07 PM

## 2018-05-13 NOTE — Progress Notes (Signed)
CODE SEPSIS - PHARMACY COMMUNICATION  **Broad Spectrum Antibiotics should be administered within 1 hour of Sepsis diagnosis**  Time Code Sepsis Called/Page Received: 3810  Antibiotics Ordered: vanc/cefepime  Time of 1st antibiotic administration: 0431  Additional action taken by pharmacy:   If necessary, Name of Provider/Nurse Contacted:     Tobie Lords ,PharmD Clinical Pharmacist  05/09/18  4:36 AM

## 2018-05-13 DEATH — deceased

## 2018-09-09 ENCOUNTER — Ambulatory Visit: Payer: Medicare Other | Admitting: Urology

## 2020-08-13 IMAGING — CT CT HEAD W/O CM
3 series · 15 of 44 positions shown, 18 images · non-contrast
Comparison: MRI brain 10/23/2016

CLINICAL DATA: Sudden onset new tremors tonight. Altered level of
consciousness. Increasing generalized weakness.

EXAM:
CT HEAD WITHOUT CONTRAST
TECHNIQUE: Contiguous axial images were obtained from the base of the skull
through the vertex without intravenous contrast.

[Series 3: head wo · axial · 0.39mm/px · z∈[+319,+429]mm · 9 of 27 slices shown, 12 images]
[im 3/27  brain]
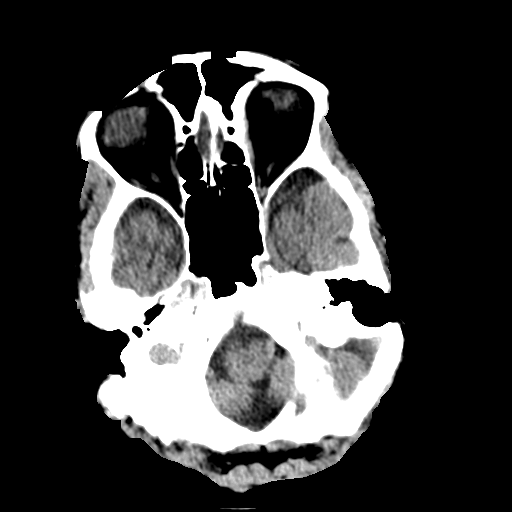
[im 3/27  bone]
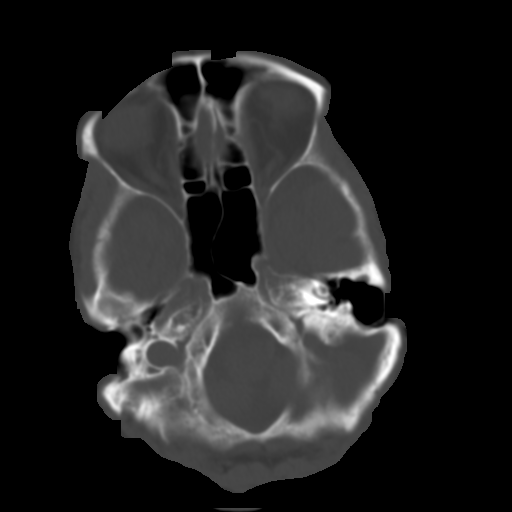
[im 6/27  brain]
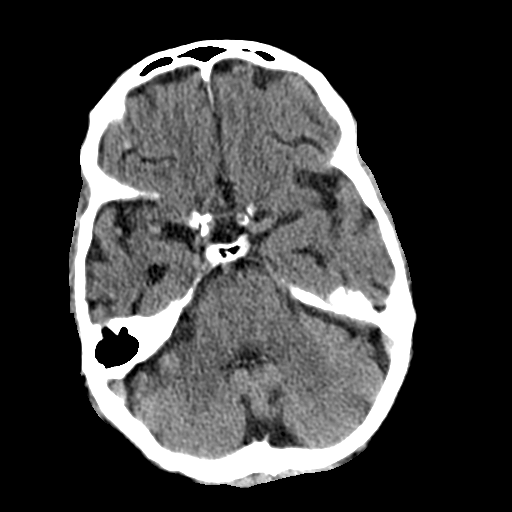
[im 8/27  brain]
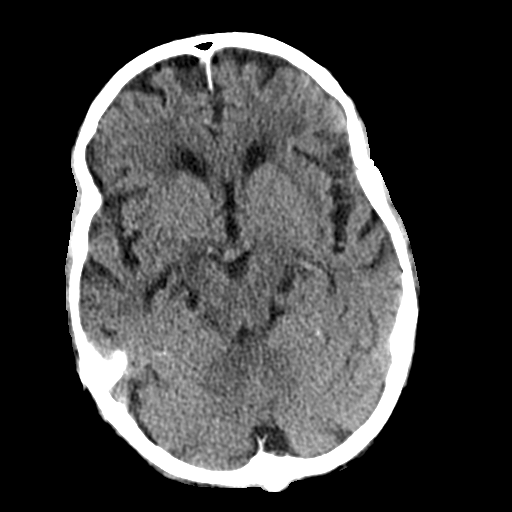
[im 11/27  brain]
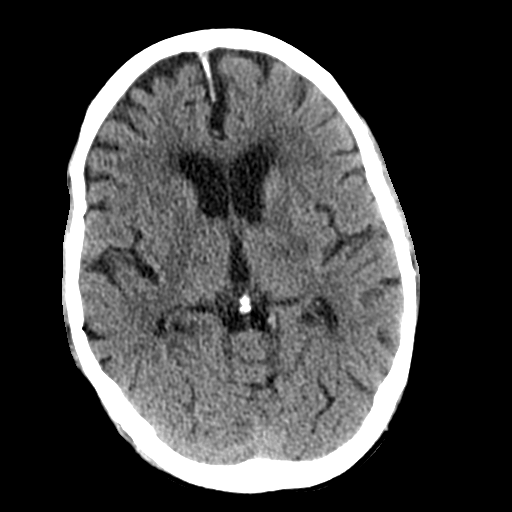
[im 14/27  brain]
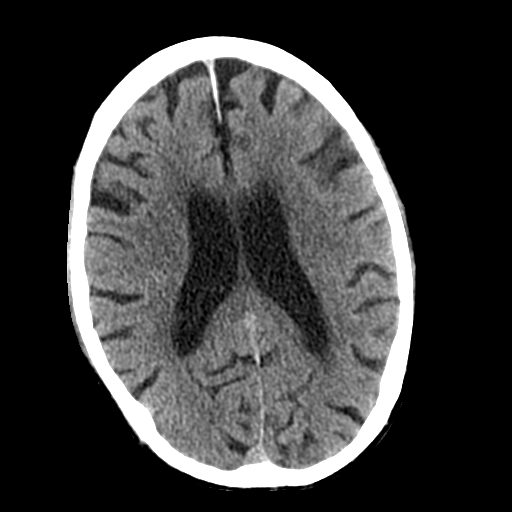
[im 14/27  bone]
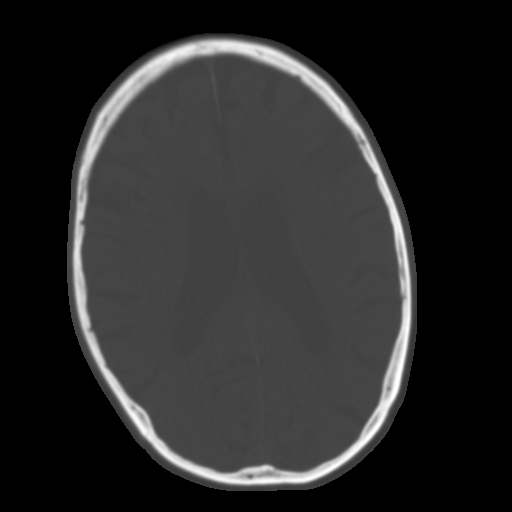
[im 17/27  brain]
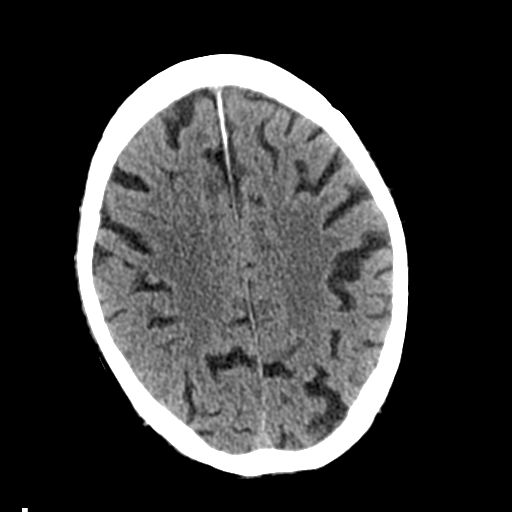
[im 20/27  brain]
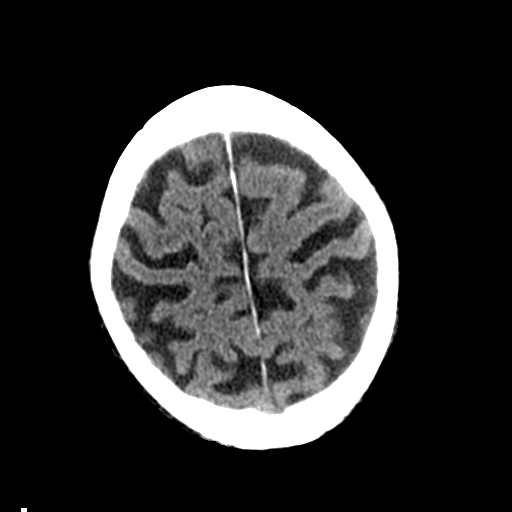
[im 22/27  brain]
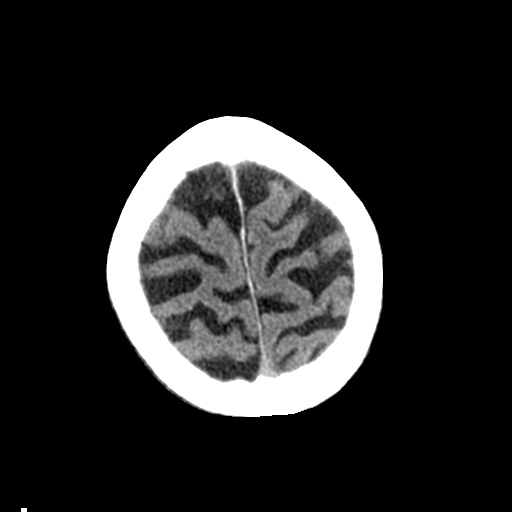
[im 25/27  brain]
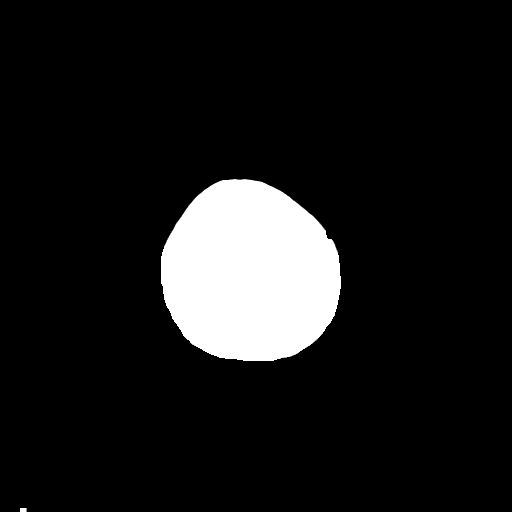
[im 25/27  bone]
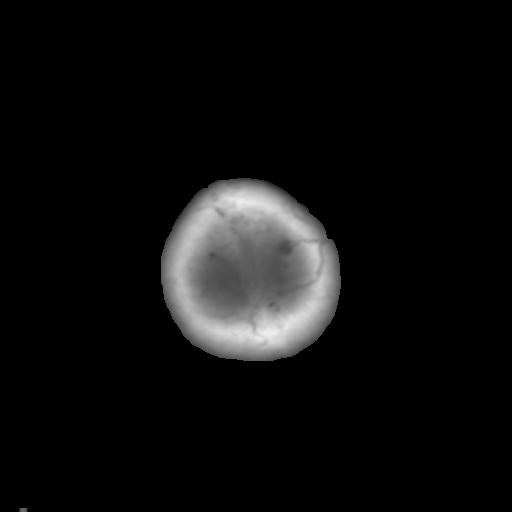

[Series 4: coronal soft tissue · coronal · 0.27mm/px · 3 of 62 slices shown]
[im 21/62  brain]
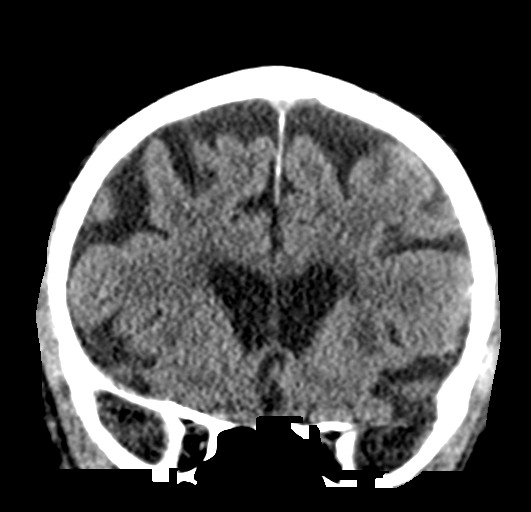
[im 28/62  brain]
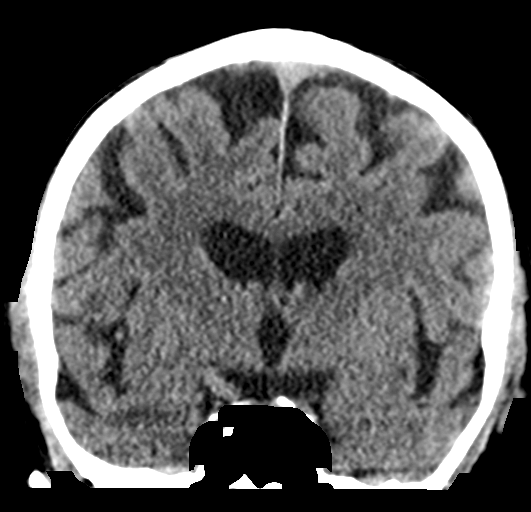
[im 34/62  brain]
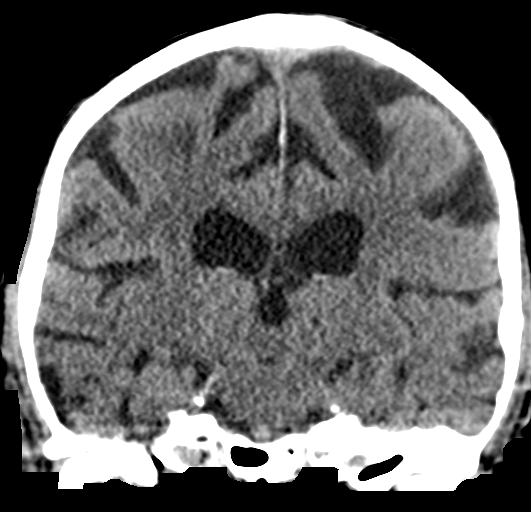

[Series 5: sagittal soft tissue · sagittal · 0.27mm/px · 3 of 48 slices shown]
[im 16/48  brain]
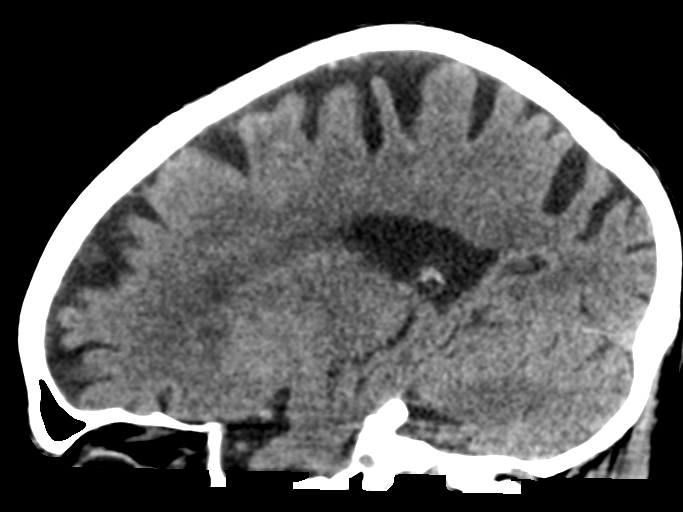
[im 24/48  brain]
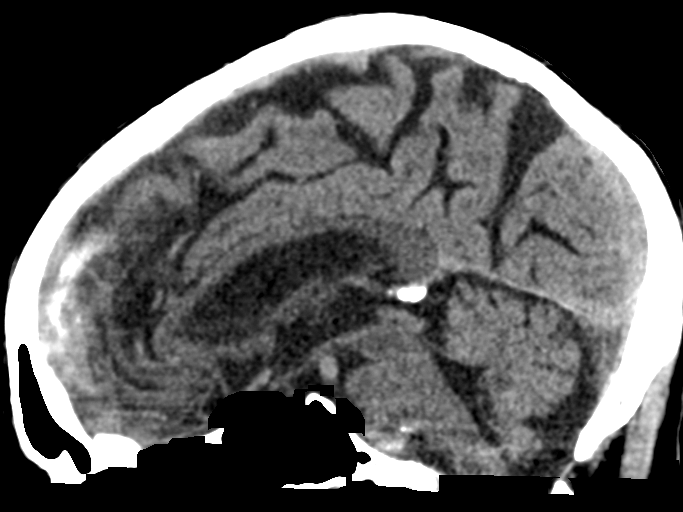
[im 32/48  brain]
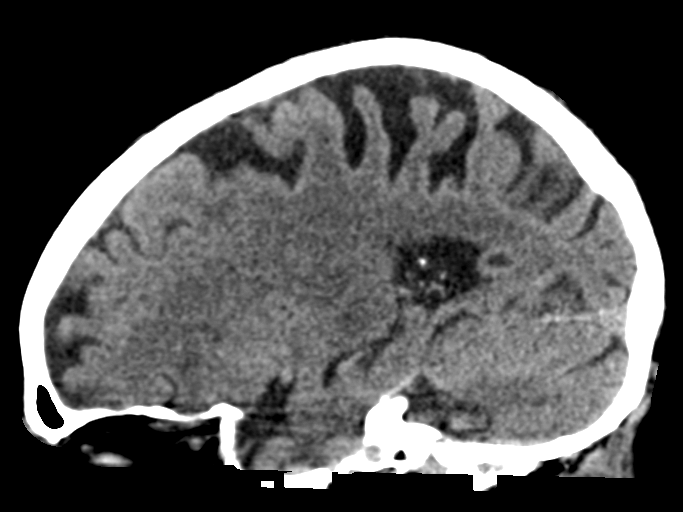

[15 of 44 positions shown; findings below may reference images not displayed]

FINDINGS: Brain: Diffuse cerebral atrophy. Mild ventricular dilatation
consistent with central atrophy. Low-attenuation changes in the deep
white matter consistent with small vessel ischemia. Old appearing
lacunar infarcts in the left thalamus. No mass effect or midline
shift. No abnormal extra-axial fluid collections. Gray-white matter
junctions are distinct. Basal cisterns are not effaced. No acute
intracranial hemorrhage.

Vascular: Intracranial arterial vascular calcifications are present.

Skull: Calvarium appears intact.

Sinuses/Orbits: Paranasal sinuses are clear. Postoperative changes
in the mastoids bilaterally. No effusions.

Other: None.
IMPRESSION: No acute intracranial abnormalities. Chronic atrophy and small
vessel ischemic changes.
